# Patient Record
Sex: Male | Born: 1995 | Race: White | Hispanic: No | Marital: Married | Smoking: Never smoker
Health system: Southern US, Community
[De-identification: ages and names within clinical notes are randomized; demographics above are authoritative.]

---
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
## 2015-07-04 ENCOUNTER — Encounter: Payer: Self-pay | Admitting: Sports Medicine

## 2015-07-04 ENCOUNTER — Ambulatory Visit: Payer: BC Managed Care – PPO | Admitting: Sports Medicine

## 2015-07-04 ENCOUNTER — Ambulatory Visit (INDEPENDENT_AMBULATORY_CARE_PROVIDER_SITE_OTHER): Payer: BC Managed Care – PPO | Admitting: Sports Medicine

## 2015-07-04 VITALS — BP 129/91 | HR 64 | Resp 18

## 2015-07-04 DIAGNOSIS — L600 Ingrowing nail: Secondary | ICD-10-CM | POA: Diagnosis not present

## 2015-07-04 DIAGNOSIS — B353 Tinea pedis: Secondary | ICD-10-CM | POA: Diagnosis not present

## 2015-07-04 NOTE — Patient Instructions (Signed)
Soak Instructions    Place 1/2 cup Epsom salt  in a quart of warm tap water.  You may use a band aid large enough to cover the area or use gauze and tape.  Apply other medications to the area as directed by the doctor such as cortisporin otic solution (ear drops) or neosporin.  IF YOUR SKIN BECOMES IRRITATED WHILE USING THESE INSTRUCTIONS, IT IS OKAY TO SWITCH TO  WHITE VINEGAR AND WATER.

## 2015-07-04 NOTE — Progress Notes (Deleted)
   Subjective:    Patient ID: Corey Pham, male    DOB: 19-Mar-1996, 19 y.o.   MRN: [id number]  HPI    Review of Systems  All other systems reviewed and are negative.      Objective:   Physical Exam        Assessment & Plan:

## 2015-07-04 NOTE — Progress Notes (Signed)
Patient ID: Corey Pham, male   DOB: December 23, 1995, 19 y.o.   MRN: [id number] Subjective: Corey Pham is a 19 y.o. male patient seen today in office for evaluation of peeling skin around right 3rd toenail; Patient states that about 3 months ago noticed skin on bottom of feet and around 3rd toe peeling; applied lotrimin cream with improvement in plantar skin however the 3rd toe began to hurt more and a blister formed at the site; at this time patient admits to popping the blister and also trimming the corner of the nail because it was appearing to ingrow >1 month ago. Patient denies constitutional symptoms. Patient denies history of Diabetes, Neuropathy, or Vascular disease. Patient has no other pedal complaints at this time.   There are no active problems to display for this patient.  No current outpatient prescriptions on file prior to visit.   No current facility-administered medications on file prior to visit.   No Known Allergies  ROS: Negative    Objective: Physical Exam  General: Well developed, nourished, no acute distress, awake, alert and oriented x 3  Vascular: Dorsalis pedis artery 2/4 bilateral, Posterior tibial artery 2/4 bilateral, skin temperature warm to warm proximal to distal bilateral lower extremities, no varicosities, pedal hair present bilateral.  Neurological: Gross sensation present via light touch bilateral   Dermatological: Skin is warm, dry, and supple bilateral, Nails 1-10 are well manicured; to Right 3rd toe there in no signs of ingrown nail at medial or lateral margin; there is scaly skin present at distal tuft with healthy pink skin underneath likely from previous history of ingrown nail to area. There is no webspace macerations present bilateral, no open lesions present bilateral, no callus/corns/hyperkeratotic tissue present bilateral. No signs of infection bilateral. No concerning skin changes for fungus at this time to plantar aspect of both feet.    Musculoskeletal: No boney deformities noted bilateral. Muscular strength within normal limits without pain or limitation on range of motion. No pain with calf compression bilateral.  Assessment and Plan:  Problem List Items Addressed This Visit    None    Visit Diagnoses    Ingrown nail    -  Primary    Right 3rd toe, currently asymptomatic patient removed lateral border 1 month ago    Tinea pedis of both feet        Currently controlled with OTC Lotrimin Cream       -Discussed treatment options; risks, benefits, alternatives discussed; all questions answered  -Advised patient to closely watch the right 3rd toe for reoccurrence; may need PNA in future -Advised patient to soak with Episom salt to allow the skin at the right 3rd toe to sloft off and apply topical neosporin to prevent infection for 1 week -Educated patient on foot hygiene/care; change socks frequently; air out shoes; antifungal spray/creams -Continue with Lotrimin cream daily as needed -Patient to return as needed for follow up evaluation or sooner if symptoms worsen.  Asencion Islamitorya Kylani Wires, DPM

## 2015-08-16 ENCOUNTER — Ambulatory Visit: Payer: Self-pay | Admitting: Physician Assistant

## 2015-09-13 ENCOUNTER — Ambulatory Visit: Payer: Self-pay | Admitting: Physician Assistant

## 2015-10-04 ENCOUNTER — Ambulatory Visit: Payer: Self-pay | Admitting: Physician Assistant

## 2016-04-22 ENCOUNTER — Ambulatory Visit: Payer: Self-pay | Admitting: Physician Assistant

## 2017-05-24 ENCOUNTER — Emergency Department: Payer: BC Managed Care – PPO

## 2017-05-24 ENCOUNTER — Emergency Department
Admission: EM | Admit: 2017-05-24 | Discharge: 2017-05-24 | Disposition: A | Discharge status: Home / Self Care | Payer: BC Managed Care – PPO | Attending: Emergency Medicine | Admitting: Emergency Medicine

## 2017-05-24 ENCOUNTER — Encounter: Payer: Self-pay | Admitting: Emergency Medicine

## 2017-05-24 DIAGNOSIS — R0789 Other chest pain: Secondary | ICD-10-CM

## 2017-05-24 DIAGNOSIS — R000 Tachycardia, unspecified: Secondary | ICD-10-CM | POA: Insufficient documentation

## 2017-05-24 LAB — TROPONIN I
Troponin I: <0.03 ng/mL (ref <0.03)
Troponin I: <0.03 ng/mL (ref <0.03)

## 2017-05-24 LAB — FIBRIN DERIVATIVES D-DIMER (ARMC ONLY): Fibrin derivatives D-dimer (ARMC): 157.89 (ref 0.00–499.00)

## 2017-05-24 LAB — BASIC METABOLIC PANEL
Anion gap: 7 (ref 5–15)
BUN: 16 mg/dL (ref 6–20)
CALCIUM: 9.1 mg/dL (ref 8.9–10.3)
CO2: 29 mmol/L (ref 22–32)
CREATININE: 1.19 mg/dL (ref 0.61–1.24)
Chloride: 107 mmol/L (ref 101–111)
GFR calc non Af Amer: >60 mL/min (ref >60)
GLUCOSE: 104 mg/dL — AB (ref 65–99)
Potassium: 3.5 mmol/L (ref 3.5–5.1)
Sodium: 143 mmol/L (ref 135–145)

## 2017-05-24 LAB — CBC
HCT: 42.9 % (ref 40.0–52.0)
Hemoglobin: 15.3 g/dL (ref 13.0–18.0)
MCH: 31.6 pg (ref 26.0–34.0)
MCHC: 35.6 g/dL (ref 32.0–36.0)
MCV: 88.7 fL (ref 80.0–100.0)
Platelets: 247 10*3/uL (ref 150–440)
RBC: 4.84 MIL/uL (ref 4.40–5.90)
RDW: 12.7 % (ref 11.5–14.5)
WBC: 15.9 10*3/uL — ABNORMAL HIGH (ref 3.8–10.6)

## 2017-05-24 MED ORDER — ACETAMINOPHEN 325 MG PO TABS
650.0000 mg | ORAL_TABLET | Freq: Once | ORAL | Status: AC
  Administered 2017-05-24: 650 mg via ORAL
  Filled 2017-05-24: qty 2

## 2017-05-24 NOTE — ED Triage Notes (Signed)
Patient states that he started having chest pain, shortness of breath and vomiting about an hour and half ago. Patient denies any cardiac history.

## 2017-05-24 NOTE — ED Notes (Signed)
Patient transported to X-ray 

## 2017-05-24 NOTE — ED Provider Notes (Signed)
[facility] Emergency Department Provider Note ____________________________________________   First MD Initiated Contact with Patient 05/24/17 [phone]     (approximate)  I have reviewed the triage vital signs and the nursing notes.   HISTORY  Chief Complaint Chest Pain; Shortness of Breath; and Emesis    HPI Corey Pham is a 21 y.o. male with no past medical history who presents with chest pain acute onset approximately 6 hours ago, substernal and nonradiating.patient states it started acutely when he was walking his dog, but it is not exertional. It is somewhat pleuritic. Patient states that afterwards he began to have chills and then had one episode of vomiting. Patient denies cough, fever, or shortness of breath. He also denies leg swelling or leg pain. No prior history of this chest pain.  History reviewed. No pertinent past medical history.  There are no active problems to display for this patient.   History reviewed. No pertinent surgical history.  Prior to Admission medications   Not on File    Allergies Patient has no known allergies.  No family history on file.  Social History Social History  Substance Use Topics  . Smoking status: Never Smoker  . Smokeless tobacco: Never Used  . Alcohol use 0.0 oz/week    Review of Systems  Constitutional: positive for chills Eyes: No visual changes. ENT: No sore throat. Cardiovascular: positive for chest pain Respiratory: Denies shortness of breath. Gastrointestinal: positive for one episode of vomiting  No diarrhea.  Genitourinary: Negative for flank pain Musculoskeletal: Negative for back pain. Skin: Negative for rash. Neurological: Negative for headaches, focal weakness or numbness.   ____________________________________________   PHYSICAL EXAM:  VITAL SIGNS: ED Triage Vitals  Enc Vitals Group     BP 05/24/17 0458 (!) 135/95     Pulse Rate 05/24/17 0458 99     Resp 05/24/17  0458 18     Temp 05/24/17 0458 98.6 F (37 C)     Temp Source 05/24/17 0458 Oral     SpO2 05/24/17 0458 100 %     Weight 05/24/17 0456 210 lb (95.3 kg)     Height 05/24/17 0456 5\' 8"  (1.727 m)     Head Circumference --      Peak Flow --      Pain Score 05/24/17 0456 8     Pain Loc --      Pain Edu? --      Excl. in GC? --     Constitutional: Alert and oriented. Well appearing and in no acute distress. Eyes: Conjunctivae are normal.  Head: Atraumatic. Nose: No congestion/rhinnorhea. Mouth/Throat: Mucous membranes are moist.   Neck: Normal range of motion.  Cardiovascular: borderline tachycardic, regular rhythm. Grossly normal heart sounds.  Good peripheral circulation. Respiratory: Normal respiratory effort.  No retractions. Lungs CTAB. Gastrointestinal: Soft and nontender. No distention.  Genitourinary: No CVA tenderness. Musculoskeletal: No lower extremity edema.  No calf tenderness.  Extremities warm and well perfused.  Neurologic:  Normal speech and language. No gross focal neurologic deficits are appreciated.  Skin:  Skin is warm and dry. No rash noted. Psychiatric: Mood and affect are normal. Speech and behavior are normal.  ____________________________________________   LABS (all labs ordered are listed, but only abnormal results are displayed)  Labs Reviewed  BASIC METABOLIC PANEL - Abnormal; Notable for the following:       Result Value   Glucose, Bld 104 (*)    All other components within normal limits  CBC -  Abnormal; Notable for the following:    WBC 15.9 (*)    All other components within normal limits  TROPONIN I  FIBRIN DERIVATIVES D-DIMER ([facility] ONLY)  TROPONIN I   ____________________________________________  EKG  ED ECG REPORT I, Dionne Bucy, the attending physician, personally viewed and interpreted this ECG.  Date: 05/24/2017 EKG Time: 457 Rate: 102 Rhythm: sinus tachycardia QRS Axis: normal Intervals: normal ST/T Wave  abnormalities: normal ST/T waves, small Q waves in lateral and inferior leads Narrative Interpretation: nonspecific findings, no evidence of acute ischemia  ____________________________________________  RADIOLOGY  Chest x-ray normal  ____________________________________________   PROCEDURES  Procedure(s) performed: No    Critical Care performed: No ____________________________________________   INITIAL IMPRESSION / ASSESSMENT AND PLAN / ED COURSE  Pertinent labs & imaging results that were available during my care of the patient were reviewed by me and considered in my medical decision making (see chart for details).  21 year old male with no past medical history and no ACS or PE risk factors presents with acute onset of atypical chest pain followed by chills and an episode of vomiting.  On exam patient is extremely well-appearing, vital signs are normal except for intermittent low-grade tachycardia, and exam is otherwise as described.  EKG and chest x-ray are normal and first troponin is negative. Only significant lab finding is elevated white blood cell count, which is nonspecific.  Overall presentation most consistent with onset of viral syndrome vs musculoskeletal cause.  Patient has no ACS risk factors and no PE risk factors. the patient's EKG is not consistent with acute ischemia; given the nonspecific Q wave finding and since patient's first troponin was drawn less than 4 hours from the onset of pain, I will repeat a second troponin now.  With tachycardia I cannot apply the PERC rule, so I will obtain d-dimer to rule out PE.  If negative and patient continues to feel well, will discharge home.   ----------------------------------------- 10:14 AM on 05/24/2017 -----------------------------------------  Repeat troponin and d-dimer are negative. Patient states he is feeling better. No further vomiting or worsening pain in the ER.  Patient feels well to go home. Return precautions  given  ____________________________________________   FINAL CLINICAL IMPRESSION(S) / ED DIAGNOSES  Final diagnoses:  Atypical chest pain      NEW MEDICATIONS STARTED DURING THIS VISIT:  There are no discharge medications for this patient.    Note:  This document was prepared using Dragon voice recognition software and may include unintentional dictation errors.    Dionne Bucy, MD 05/24/17 [phone]

## 2017-05-24 NOTE — Discharge Instructions (Signed)
You may call [phone] or [phone] for a referral service to get a primary care doctor.  Return to the ER for new or worsening pain, constant pain, weakness or lightheadedness, difficulty breathing, or any other new or worsening symptoms that concern you.

## 2018-02-09 IMAGING — CR DG CHEST 2V
1 series · 2 of 2 positions shown · non-contrast
Comparison: None.

CLINICAL DATA: Chest pain beginning this morning, vomiting.

EXAM:
CHEST  2 VIEW

[Series 1: dg chest 2 view · 0.14mm/px · 2 of 2 slices shown]
[im 1/2]
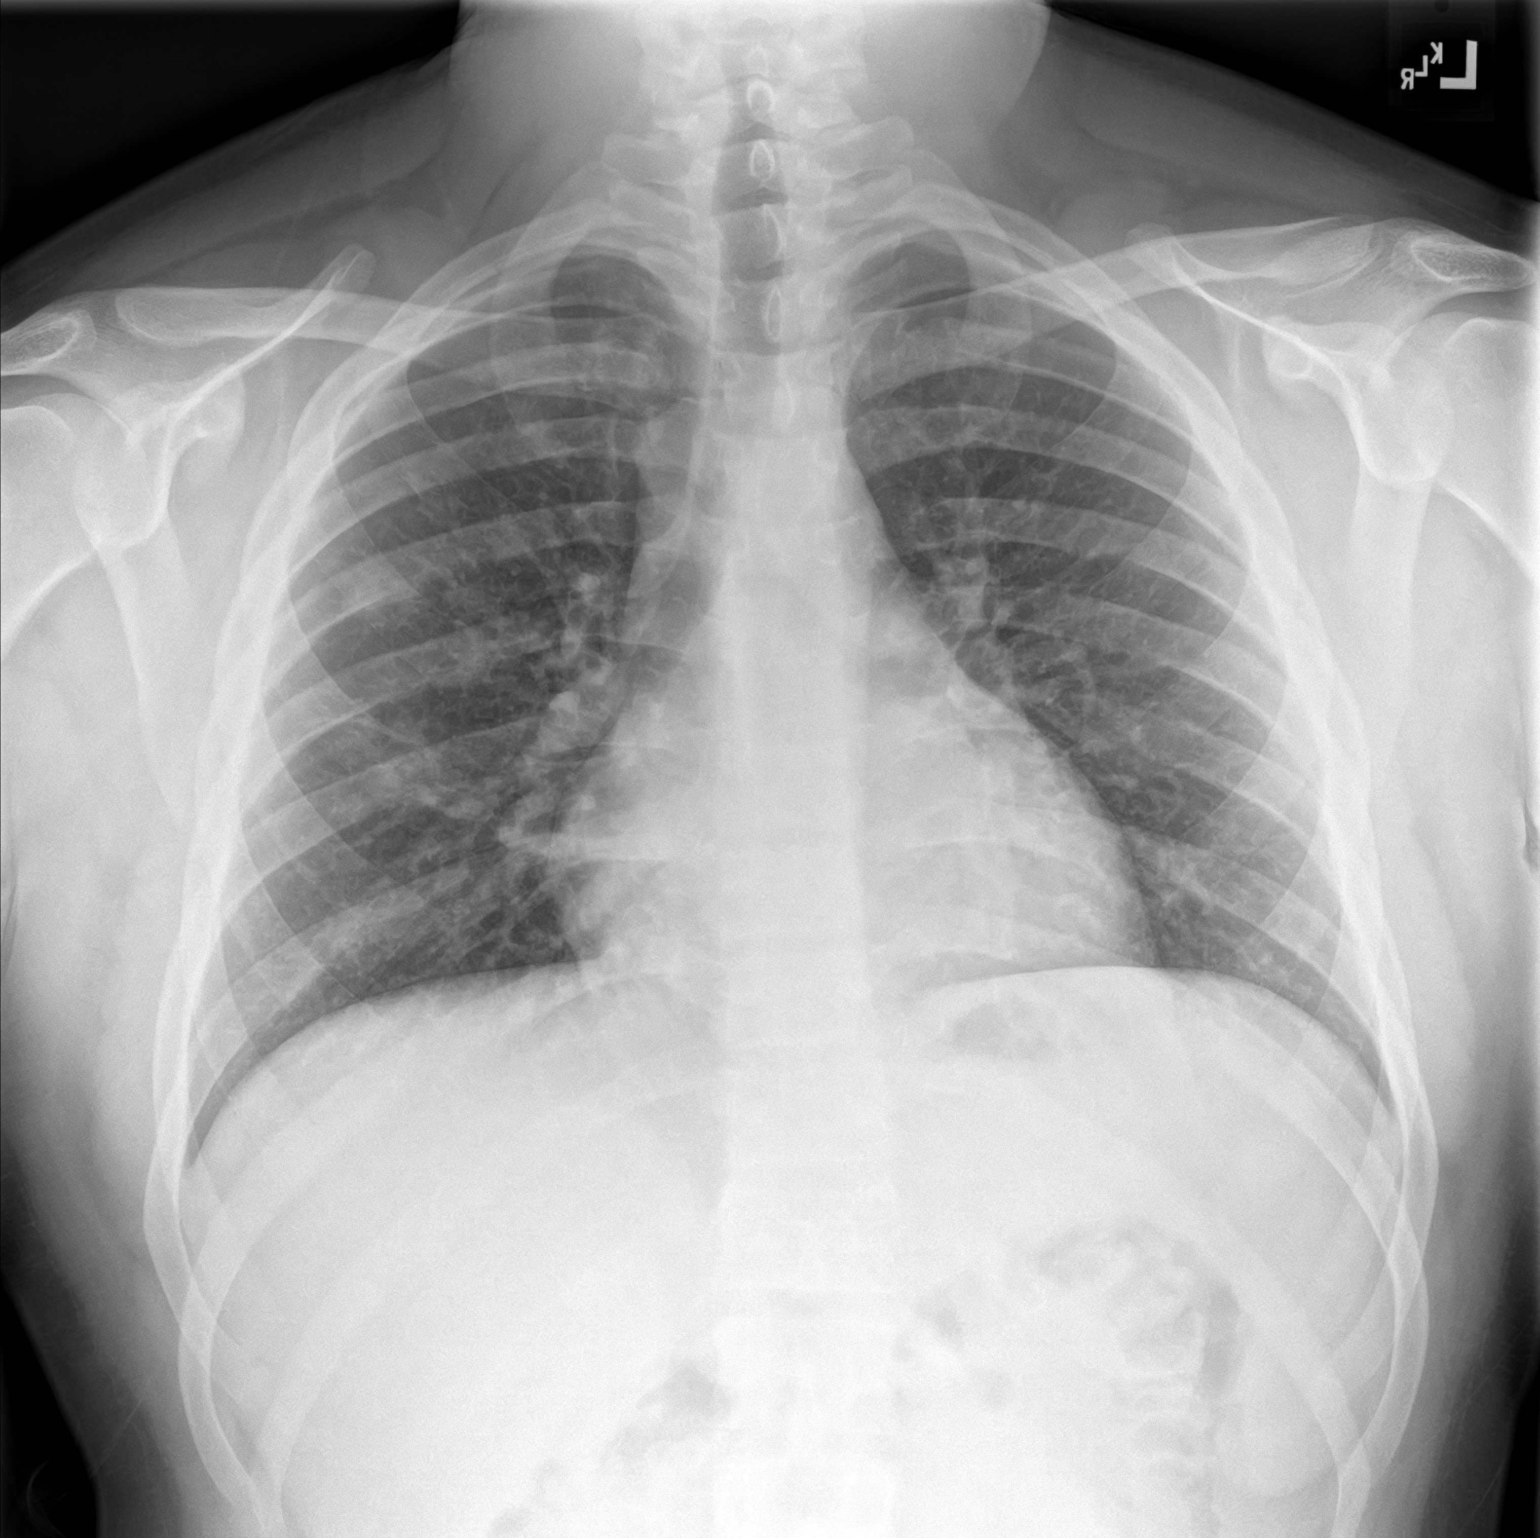
[im 2/2]
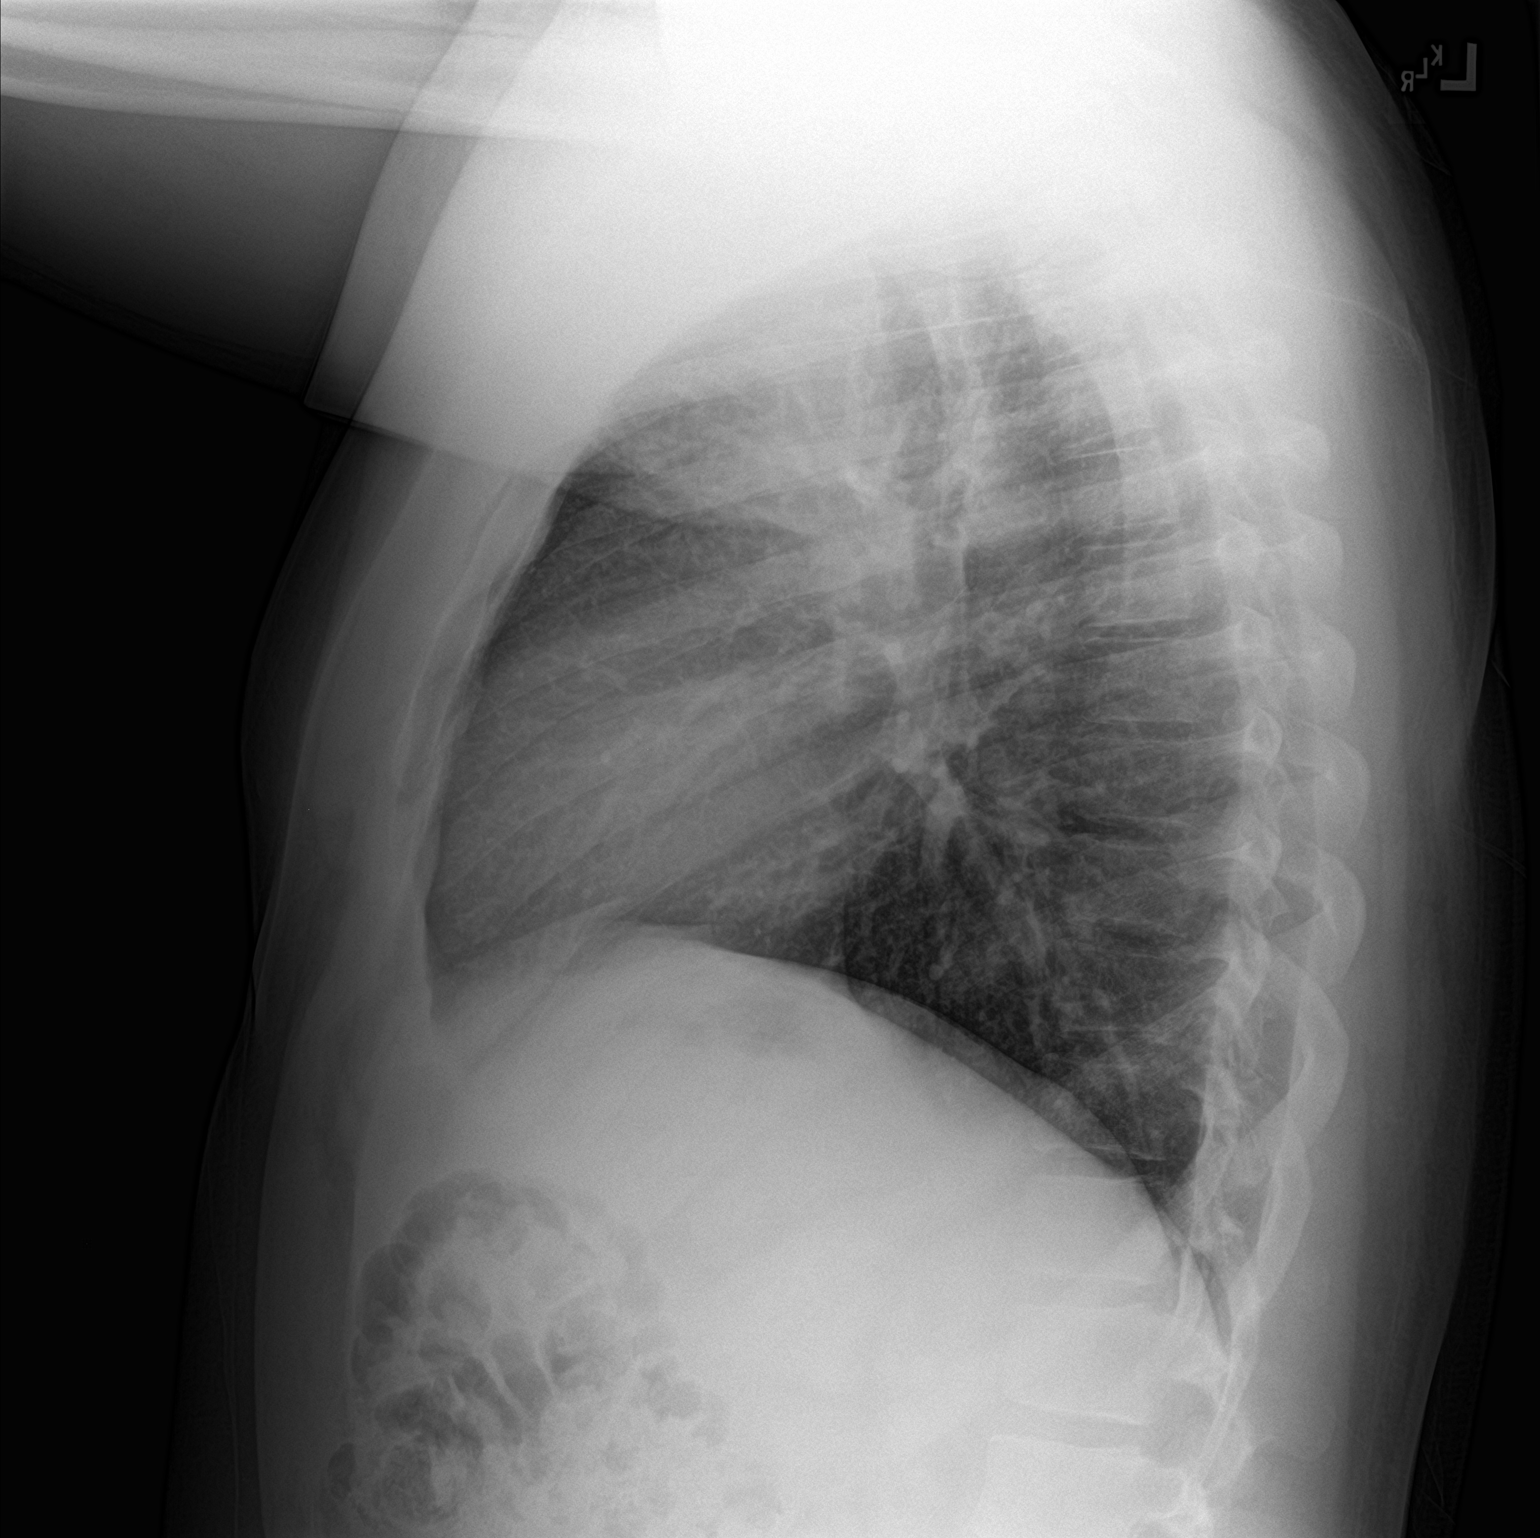

[2 of 2 positions shown; findings below may reference images not displayed]

FINDINGS: The heart size and mediastinal contours are within normal limits.
Both lungs are clear. The visualized skeletal structures are
unremarkable.
IMPRESSION: Normal chest radiograph.

## 2019-04-29 ENCOUNTER — Other Ambulatory Visit: Payer: Self-pay

## 2019-04-29 DIAGNOSIS — Z20822 Contact with and (suspected) exposure to covid-19: Secondary | ICD-10-CM

## 2019-05-01 LAB — SPECIMEN STATUS REPORT

## 2019-05-01 LAB — NOVEL CORONAVIRUS, NAA: SARS-CoV-2, NAA: NOT DETECTED

## 2019-05-17 ENCOUNTER — Other Ambulatory Visit: Payer: Self-pay

## 2019-05-17 DIAGNOSIS — Z20822 Contact with and (suspected) exposure to covid-19: Secondary | ICD-10-CM

## 2019-05-19 LAB — NOVEL CORONAVIRUS, NAA: SARS-CoV-2, NAA: NOT DETECTED

## 2020-05-30 ENCOUNTER — Encounter: Payer: Self-pay | Admitting: Podiatry

## 2020-05-30 ENCOUNTER — Ambulatory Visit: Payer: PRIVATE HEALTH INSURANCE | Admitting: Podiatry

## 2020-05-30 ENCOUNTER — Other Ambulatory Visit: Payer: Self-pay

## 2020-05-30 DIAGNOSIS — M79674 Pain in right toe(s): Secondary | ICD-10-CM | POA: Diagnosis not present

## 2020-05-30 DIAGNOSIS — L600 Ingrowing nail: Secondary | ICD-10-CM

## 2020-05-31 ENCOUNTER — Encounter: Payer: Self-pay | Admitting: Podiatry

## 2020-05-31 NOTE — Progress Notes (Signed)
  Subjective:  Patient ID: Corey Pham, male    DOB: 06/07/1996,  MRN: [id number]  Chief Complaint  Patient presents with  . Nail Problem    Patient presents today for ingrown toenail right hallux x 3 weeks.  He says its sore but not that painful.      24 y.o. male presents with the above complaint.  Patient presents with right hallux lateral border ingrown that has been going on for quite some time.  Patient states is painful to touch.  He had an ingrown removed long time ago by Dr. Marylene Land back in 2016.  He denies any other acute complaints.  He has been going for 3 weeks he did some clipping to get the nail out.  But it keeps happening and reoccurring.  He denies any other acute complaints.  He would like to have it completely removed.   Review of Systems: Negative except as noted in the HPI. Denies N/V/F/Ch.  History reviewed. No pertinent past medical history. No current outpatient medications on file.  Social History   Tobacco Use  Smoking Status Never Smoker  Smokeless Tobacco Never Used    No Known Allergies Objective:  There were no vitals filed for this visit. There is no height or weight on file to calculate BMI. Constitutional Well developed. Well nourished.  Vascular Dorsalis pedis pulses palpable bilaterally. Posterior tibial pulses palpable bilaterally. Capillary refill normal to all digits.  No cyanosis or clubbing noted. Pedal hair growth normal.  Neurologic Normal speech. Oriented to person, place, and time. Epicritic sensation to light touch grossly present bilaterally.  Dermatologic Painful ingrowing nail at lateral nail borders of the hallux nail right. No other open wounds. No skin lesions.  Orthopedic: Normal joint ROM without pain or crepitus bilaterally. No visible deformities. No bony tenderness.   Radiographs: None Assessment:   1. Ingrown toenail of right foot   2. Great toe pain, right    Plan:  Patient was evaluated and treated and  all questions answered.  Ingrown Nail, right -Patient elects to proceed with minor surgery to remove ingrown toenail removal today. Consent reviewed and signed by patient. -Ingrown nail excised. See procedure note. -Educated on post-procedure care including soaking. Written instructions provided and reviewed. -Patient to follow up in 2 weeks for nail check.  Procedure: Excision of Ingrown Toenail Location: Right 1st toe lateral nail borders. Anesthesia: Lidocaine 1% plain; 1.5 mL and Marcaine 0.5% plain; 1.5 mL, digital block. Skin Prep: Betadine. Dressing: Silvadene; telfa; dry, sterile, compression dressing. Technique: Following skin prep, the toe was exsanguinated and a tourniquet was secured at the base of the toe. The affected nail border was freed, split with a nail splitter, and excised. Chemical matrixectomy was then performed with phenol and irrigated out with alcohol. The tourniquet was then removed and sterile dressing applied. Disposition: Patient tolerated procedure well. Patient to return in 2 weeks for follow-up.   No follow-ups on file.

## 2020-11-13 ENCOUNTER — Ambulatory Visit: Payer: Self-pay | Admitting: Family Medicine

## 2022-04-11 ENCOUNTER — Other Ambulatory Visit: Payer: Self-pay

## 2022-04-11 ENCOUNTER — Observation Stay
Admission: EM | Admit: 2022-04-11 | Discharge: 2022-04-12 | Disposition: A | Discharge status: Home / Self Care | Payer: PRIVATE HEALTH INSURANCE | Attending: Internal Medicine | Admitting: Internal Medicine

## 2022-04-11 DIAGNOSIS — E871 Hypo-osmolality and hyponatremia: Secondary | ICD-10-CM

## 2022-04-11 DIAGNOSIS — E860 Dehydration: Secondary | ICD-10-CM | POA: Diagnosis not present

## 2022-04-11 DIAGNOSIS — A419 Sepsis, unspecified organism: Secondary | ICD-10-CM

## 2022-04-11 DIAGNOSIS — R112 Nausea with vomiting, unspecified: Secondary | ICD-10-CM | POA: Diagnosis present

## 2022-04-11 DIAGNOSIS — F17200 Nicotine dependence, unspecified, uncomplicated: Secondary | ICD-10-CM | POA: Insufficient documentation

## 2022-04-11 DIAGNOSIS — Z20822 Contact with and (suspected) exposure to covid-19: Secondary | ICD-10-CM | POA: Diagnosis not present

## 2022-04-11 DIAGNOSIS — R519 Headache, unspecified: Secondary | ICD-10-CM | POA: Insufficient documentation

## 2022-04-11 DIAGNOSIS — Z720 Tobacco use: Secondary | ICD-10-CM

## 2022-04-11 DIAGNOSIS — J189 Pneumonia, unspecified organism: Secondary | ICD-10-CM | POA: Diagnosis not present

## 2022-04-11 LAB — CBC WITH DIFFERENTIAL/PLATELET
Abs Immature Granulocytes: 0.02 10*3/uL (ref 0.00–0.07)
Basophils Absolute: 0 10*3/uL (ref 0.0–0.1)
Basophils Relative: 0 %
Eosinophils Absolute: 0 10*3/uL (ref 0.0–0.5)
Eosinophils Relative: 0 %
HCT: 43.1 % (ref 39.0–52.0)
Hemoglobin: 15.2 g/dL (ref 13.0–17.0)
Immature Granulocytes: 0 %
Lymphocytes Relative: 18 %
Lymphs Abs: 1.4 10*3/uL (ref 0.7–4.0)
MCH: 30 pg (ref 26.0–34.0)
MCHC: 35.3 g/dL (ref 30.0–36.0)
MCV: 85.2 fL (ref 80.0–100.0)
Monocytes Absolute: 0.8 10*3/uL (ref 0.1–1.0)
Monocytes Relative: 11 %
Neutro Abs: 5.5 10*3/uL (ref 1.7–7.7)
Neutrophils Relative %: 71 %
Platelets: 266 10*3/uL (ref 150–400)
RBC: 5.06 MIL/uL (ref 4.22–5.81)
RDW: 11.7 % (ref 11.5–15.5)
WBC: 7.8 10*3/uL (ref 4.0–10.5)
nRBC: 0 % (ref 0.0–0.2)

## 2022-04-11 LAB — CBC
HCT: 42.8 % (ref 39.0–52.0)
Hemoglobin: 15.2 g/dL (ref 13.0–17.0)
MCH: 29.9 pg (ref 26.0–34.0)
MCHC: 35.5 g/dL (ref 30.0–36.0)
MCV: 84.3 fL (ref 80.0–100.0)
Platelets: 251 10*3/uL (ref 150–400)
RBC: 5.08 MIL/uL (ref 4.22–5.81)
RDW: 11.6 % (ref 11.5–15.5)
WBC: 7.9 10*3/uL (ref 4.0–10.5)
nRBC: 0 % (ref 0.0–0.2)

## 2022-04-11 LAB — COMPREHENSIVE METABOLIC PANEL
ALT: 40 U/L (ref 0–44)
AST: 34 U/L (ref 15–41)
Albumin: 4.7 g/dL (ref 3.5–5.0)
Alkaline Phosphatase: 51 U/L (ref 38–126)
Anion gap: 10 (ref 5–15)
BUN: 17 mg/dL (ref 6–20)
CO2: 23 mmol/L (ref 22–32)
Calcium: 9.4 mg/dL (ref 8.9–10.3)
Chloride: 101 mmol/L (ref 98–111)
Creatinine, Ser: 1.14 mg/dL (ref 0.61–1.24)
GFR, Estimated: >60 mL/min (ref >60)
Glucose, Bld: 94 mg/dL (ref 70–99)
Potassium: 3.8 mmol/L (ref 3.5–5.1)
Sodium: 134 mmol/L — ABNORMAL LOW (ref 135–145)
Total Bilirubin: 2 mg/dL — ABNORMAL HIGH (ref 0.3–1.2)
Total Protein: 8 g/dL (ref 6.5–8.1)

## 2022-04-11 LAB — URINALYSIS, ROUTINE W REFLEX MICROSCOPIC
Bilirubin Urine: NEGATIVE
Glucose, UA: NEGATIVE mg/dL
Hgb urine dipstick: NEGATIVE
Ketones, ur: 80 mg/dL — AB
Leukocytes,Ua: NEGATIVE
Nitrite: NEGATIVE
Protein, ur: NEGATIVE mg/dL
Specific Gravity, Urine: 1.024 (ref 1.005–1.030)
pH: 5 (ref 5.0–8.0)

## 2022-04-11 LAB — SARS CORONAVIRUS 2 BY RT PCR: SARS Coronavirus 2 by RT PCR: NEGATIVE

## 2022-04-11 LAB — LIPASE, BLOOD: Lipase: 27 U/L (ref 11–51)

## 2022-04-11 MED ORDER — ONDANSETRON 4 MG PO TBDP
4.0000 mg | ORAL_TABLET | Freq: Once | ORAL | Status: AC | PRN
  Administered 2022-04-11: 4 mg via ORAL
  Filled 2022-04-11: qty 1

## 2022-04-11 NOTE — ED Provider Notes (Signed)
[facility] Provider Note    Event Date/Time   First MD Initiated Contact with Patient 04/11/22 2339     (approximate)   History   Emesis and Nausea   HPI  Corey Pham is a 26 y.o. male who presents to the ED from home with a 4-day history of nausea, vomiting, darker stools, fever, body aches, bilateral neck pain and headache.  States he took COVID test at home which was negative.  Denies sick contacts.  Endorses mild cough.  States he has not taken PO well since Monday.  Has not taken any Tylenol today.  Denies recent travel or antibiotic use.  + Smoker.     Past Medical History  None   Active Problem List  There are no problems to display for this patient.    Past Surgical History  History reviewed. No pertinent surgical history.   Home Medications   Prior to Admission medications   Not on File     Allergies  Patient has no known allergies.   Family History  History reviewed. No pertinent family history.   Physical Exam  Triage Vital Signs: ED Triage Vitals  Enc Vitals Group     BP 04/11/22 2116 132/84     Pulse Rate 04/11/22 2116 (!) 136     Resp 04/11/22 2116 18     Temp 04/11/22 2116 99.1 F (37.3 C)     Temp Source 04/11/22 2116 Oral     SpO2 04/11/22 2116 99 %     Weight 04/11/22 2117 210 lb (95.3 kg)     Height 04/11/22 2117 5\' 8"  (1.727 m)     Head Circumference --      Peak Flow --      Pain Score 04/11/22 2124 0     Pain Loc --      Pain Edu? --      Excl. in GC? --     Updated Vital Signs: BP 133/80   Pulse (!) 116   Temp 99.1 F (37.3 C) (Oral)   Resp 18   Ht 5\' 8"  (1.727 m)   Wt 95.3 kg   SpO2 99%   BMI 31.93 kg/m    General: Awake, no distress.  CV:  Tachycardic.  Good peripheral perfusion.  Resp:  Normal effort.  CTA B. Abd:  Nontender to light or deep palpation.  No distention.  Other:  Bilateral TM dullness.  Oropharynx clear.  Mildly dry mucous membranes.  Supple neck without  meningismus.  No carotid bruits.  No petechiae.   ED Results / Procedures / Treatments  Labs (all labs ordered are listed, but only abnormal results are displayed) Labs Reviewed  COMPREHENSIVE METABOLIC PANEL - Abnormal; Notable for the following components:      Result Value   Sodium 134 (*)    Total Bilirubin 2.0 (*)    All other components within normal limits  URINALYSIS, ROUTINE W REFLEX MICROSCOPIC - Abnormal; Notable for the following components:   Color, Urine YELLOW (*)    APPearance CLEAR (*)    Ketones, ur 80 (*)    All other components within normal limits  SARS CORONAVIRUS 2 BY RT PCR  CULTURE, BLOOD (ROUTINE X 2)  CULTURE, BLOOD (ROUTINE X 2)  LIPASE, BLOOD  CBC  LACTIC ACID, PLASMA  PROCALCITONIN  MONONUCLEOSIS SCREEN  CBC WITH DIFFERENTIAL/PLATELET  CK  DIFFERENTIAL  LACTIC ACID, PLASMA     EKG  None   RADIOLOGY I have independently  visualized and interpreted patient's chest x-ray, CT head, CT abdomen/pelvis as well as noted the radiology interpretation:  Chest xray: No acute cardiopulmonary process  CT Head: No ICH  CT Abdomen/Pelvis: Right lower lobe infiltrate  Official radiology report(s): CT Abdomen Pelvis W Contrast  Result Date: 04/12/2022 CLINICAL DATA:  Nausea with vomiting and neck pain. EXAM: CT ABDOMEN AND PELVIS WITH CONTRAST TECHNIQUE: Multidetector CT imaging of the abdomen and pelvis was performed using the standard protocol following bolus administration of intravenous contrast. RADIATION DOSE REDUCTION: This exam was performed according to the departmental dose-optimization program which includes automated exposure control, adjustment of the mA and/or kV according to patient size and/or use of iterative reconstruction technique. CONTRAST:  161mL OMNIPAQUE IOHEXOL 300 MG/ML  SOLN COMPARISON:  None Available. FINDINGS: Lower chest: Very mild, hazy atelectasis versus early infiltrate is seen within the posterior aspect of the right lung  base. Hepatobiliary: No focal liver abnormality is seen. No gallstones, gallbladder wall thickening, or biliary dilatation. Pancreas: Unremarkable. No pancreatic ductal dilatation or surrounding inflammatory changes. Spleen: Normal in size without focal abnormality. Adrenals/Urinary Tract: Adrenal glands are unremarkable. Kidneys are normal, without renal calculi, focal lesion, or hydronephrosis. Bladder is unremarkable. Stomach/Bowel: Stomach is within normal limits. Appendix appears normal. No evidence of bowel wall thickening, distention, or inflammatory changes. Vascular/Lymphatic: No significant vascular findings are present. No enlarged abdominal or pelvic lymph nodes. Reproductive: Prostate is unremarkable. Other: No abdominal wall hernia or abnormality. No abdominopelvic ascites. Musculoskeletal: No acute or significant osseous findings. IMPRESSION: 1. Very mild posterior right basilar atelectasis and/or early infiltrate. 2. No acute or active process within the abdomen or pelvis. Electronically Signed   By: Virgina Norfolk M.D.   On: 04/12/2022 00:53   CT Head Wo Contrast  Result Date: 04/12/2022 CLINICAL DATA:  Nausea with vomiting and neck pain. EXAM: CT HEAD WITHOUT CONTRAST TECHNIQUE: Contiguous axial images were obtained from the base of the skull through the vertex without intravenous contrast. RADIATION DOSE REDUCTION: This exam was performed according to the departmental dose-optimization program which includes automated exposure control, adjustment of the mA and/or kV according to patient size and/or use of iterative reconstruction technique. COMPARISON:  None Available. FINDINGS: Brain: No evidence of acute infarction, hemorrhage, hydrocephalus, extra-axial collection or mass lesion/mass effect. Vascular: No hyperdense vessel or unexpected calcification. Skull: Normal. Negative for fracture or focal lesion. Sinuses/Orbits: No acute finding. Other: None. IMPRESSION: No acute intracranial  pathology. Electronically Signed   By: Virgina Norfolk M.D.   On: 04/12/2022 00:47   DG Chest Port 1 View  Result Date: 04/12/2022 CLINICAL DATA:  Fever and cough.  Nausea and vomiting since Monday. EXAM: PORTABLE CHEST 1 VIEW COMPARISON:  05/24/2017 FINDINGS: The heart size and mediastinal contours are within normal limits. Both lungs are clear. The visualized skeletal structures are unremarkable. IMPRESSION: No active disease. Electronically Signed   By: Lucienne Capers M.D.   On: 04/12/2022 00:25     PROCEDURES:  Critical Care performed: Yes, see critical care procedure note(s)  CRITICAL CARE Performed by: Paulette Blanch   Total critical care time: 30 minutes  Critical care time was exclusive of separately billable procedures and treating other patients.  Critical care was necessary to treat or prevent imminent or life-threatening deterioration.  Critical care was time spent personally by me on the following activities: development of treatment plan with patient and/or surrogate as well as nursing, discussions with consultants, evaluation of patient's response to treatment, examination of patient, obtaining history from patient  or surrogate, ordering and performing treatments and interventions, ordering and review of laboratory studies, ordering and review of radiographic studies, pulse oximetry and re-evaluation of patient's condition.   Marland Kitchen1-3 Lead EKG Interpretation  Performed by: Paulette Blanch, MD Authorized by: Paulette Blanch, MD     Interpretation: abnormal     ECG rate:  110   ECG rate assessment: tachycardic     Rhythm: sinus tachycardia     Ectopy: none     Conduction: normal   Comments:     Patient placed on cardiac monitor to evaluate for arrhythmias    MEDICATIONS ORDERED IN ED: Medications  sodium chloride 0.9 % bolus 1,000 mL (has no administration in time range)    And  sodium chloride 0.9 % bolus 1,000 mL (has no administration in time range)  cefTRIAXone  (ROCEPHIN) 2 g in sodium chloride 0.9 % 100 mL IVPB (has no administration in time range)  azithromycin (ZITHROMAX) 500 mg in sodium chloride 0.9 % 250 mL IVPB (has no administration in time range)  ondansetron (ZOFRAN-ODT) disintegrating tablet 4 mg (4 mg Oral Given 04/11/22 2130)  sodium chloride 0.9 % bolus 1,000 mL (1,000 mLs Intravenous New Bag/Given 04/12/22 0033)  ondansetron (ZOFRAN) injection 4 mg (4 mg Intravenous Given 04/12/22 0032)  ketorolac (TORADOL) 30 MG/ML injection 15 mg (15 mg Intravenous Given 04/12/22 0032)  iohexol (OMNIPAQUE) 300 MG/ML solution 100 mL (100 mLs Intravenous Contrast Given 04/12/22 0035)     IMPRESSION / MDM / ASSESSMENT AND PLAN / ED COURSE  I reviewed the triage vital signs and the nursing notes.                             26 year old male presenting with a 4-day history of nausea/vomiting, fever, cough, myalgias, headache and neck pain.  Differential diagnosis includes but is not limited to viral process such as COVID-19, influenza, community-acquired pneumonia, mononucleosis, meningitis, upper respiratory infection, gastroenteritis, appendicitis, etc. I have personally reviewed patient's records and note a last urgent care visit from May 2022 for suspected COVID.  Patient's presentation is most consistent with acute presentation with potential threat to life or bodily function.  The patient is on the cardiac monitor to evaluate for evidence of arrhythmia and/or significant heart rate changes.  Laboratory results demonstrate normal WBC 7.8, normal electrolytes, ketonuria, COVID is negative.  Will obtain blood cultures, lactic acid, procalcitonin, Monospot.  Obtain chest x-ray, CT head, abdomen/pelvis.  Initiate IV fluid hydration, IV Toradol for myalgias, IV Zofran for nausea.  Will reassess.  Clinical Course as of 04/12/22 0108  Fri Apr 12, 2022  0106 Updated patient and family member and elevated procalcitonin, unremarkable CT head, likely right lower  lobe pneumonia on CT.  Will initiate ED code sepsis, continue IV fluid resuscitation, broad-spectrum IV antibiotics.  Will consult hospitalist services for evaluation and admission. [JS]    Clinical Course User Index [JS] Paulette Blanch, MD     FINAL CLINICAL IMPRESSION(S) / ED DIAGNOSES   Final diagnoses:  Dehydration  Nausea and vomiting, unspecified vomiting type  Community acquired pneumonia of right lower lobe of lung  Sepsis, due to unspecified organism, unspecified whether acute organ dysfunction present [facility])     Rx / DC Orders   ED Discharge Orders     None        Note:  This document was prepared using Dragon voice recognition software and may include unintentional dictation errors.  Irean Hong, MD 04/12/22 [phone]

## 2022-04-11 NOTE — ED Triage Notes (Addendum)
Pt presents to ER from home c/o nausea and vomiting since Monday night.  Also c/o neck pain and HA.  Pt denies any sick contacts that he knows of.  States his stools his stools have been darker in nature.  Pt states he had fever of 104 before coming in today.  Has not taken any tylenol recently.  States he took covid test at home that was negative.  Pt is A&O x4 at this time in NAD.

## 2022-04-12 ENCOUNTER — Emergency Department: Payer: PRIVATE HEALTH INSURANCE

## 2022-04-12 DIAGNOSIS — A419 Sepsis, unspecified organism: Secondary | ICD-10-CM

## 2022-04-12 DIAGNOSIS — E871 Hypo-osmolality and hyponatremia: Secondary | ICD-10-CM

## 2022-04-12 DIAGNOSIS — J189 Pneumonia, unspecified organism: Principal | ICD-10-CM

## 2022-04-12 DIAGNOSIS — Z720 Tobacco use: Secondary | ICD-10-CM | POA: Diagnosis not present

## 2022-04-12 LAB — CBC
HCT: 36.7 % — ABNORMAL LOW (ref 39.0–52.0)
Hemoglobin: 13.1 g/dL (ref 13.0–17.0)
MCH: 30.3 pg (ref 26.0–34.0)
MCHC: 35.7 g/dL (ref 30.0–36.0)
MCV: 85 fL (ref 80.0–100.0)
Platelets: 200 10*3/uL (ref 150–400)
RBC: 4.32 MIL/uL (ref 4.22–5.81)
RDW: 11.8 % (ref 11.5–15.5)
WBC: 5.8 10*3/uL (ref 4.0–10.5)
nRBC: 0 % (ref 0.0–0.2)

## 2022-04-12 LAB — BASIC METABOLIC PANEL
Anion gap: 8 (ref 5–15)
BUN: 16 mg/dL (ref 6–20)
CO2: 20 mmol/L — ABNORMAL LOW (ref 22–32)
Calcium: 8.3 mg/dL — ABNORMAL LOW (ref 8.9–10.3)
Chloride: 109 mmol/L (ref 98–111)
Creatinine, Ser: 1.16 mg/dL (ref 0.61–1.24)
GFR, Estimated: >60 mL/min (ref >60)
Glucose, Bld: 88 mg/dL (ref 70–99)
Potassium: 4.2 mmol/L (ref 3.5–5.1)
Sodium: 137 mmol/L (ref 135–145)

## 2022-04-12 LAB — HIV ANTIBODY (ROUTINE TESTING W REFLEX): HIV Screen 4th Generation wRfx: NONREACTIVE

## 2022-04-12 LAB — PROCALCITONIN
Procalcitonin: 10.21 ng/mL
Procalcitonin: 7.06 ng/mL

## 2022-04-12 LAB — PROTIME-INR
INR: 1.1 (ref 0.8–1.2)
Prothrombin Time: 14.2 seconds (ref 11.4–15.2)

## 2022-04-12 LAB — CK: Total CK: 221 U/L (ref 49–397)

## 2022-04-12 LAB — LACTIC ACID, PLASMA
Lactic Acid, Venous: 1.1 mmol/L (ref 0.5–1.9)
Lactic Acid, Venous: 1.1 mmol/L (ref 0.5–1.9)

## 2022-04-12 LAB — MONONUCLEOSIS SCREEN: Mono Screen: NEGATIVE

## 2022-04-12 LAB — CORTISOL-AM, BLOOD: Cortisol - AM: 8.7 ug/dL (ref 6.7–22.6)

## 2022-04-12 MED ORDER — SODIUM CHLORIDE 0.9 % IV SOLN: 500.0000 mg | INTRAVENOUS | Status: DC

## 2022-04-12 MED ORDER — SODIUM CHLORIDE 0.9 % IV BOLUS
1000.0000 mL | Freq: Once | INTRAVENOUS | Status: AC
  Administered 2022-04-12: 1000 mL via INTRAVENOUS

## 2022-04-12 MED ORDER — SODIUM CHLORIDE 0.9 % IV SOLN
2.0000 g | INTRAVENOUS | Status: DC
  Administered 2022-04-12: 2 g via INTRAVENOUS
  Filled 2022-04-12: qty 20

## 2022-04-12 MED ORDER — SODIUM CHLORIDE 0.9 % IV SOLN: 2.0000 g | INTRAVENOUS | Status: DC

## 2022-04-12 MED ORDER — ACETAMINOPHEN 650 MG RE SUPP: 650.0000 mg | Freq: Four times a day (QID) | RECTAL | Status: DC | PRN

## 2022-04-12 MED ORDER — ENOXAPARIN SODIUM 40 MG/0.4ML IJ SOSY: 40.0000 mg | PREFILLED_SYRINGE | INTRAMUSCULAR | Status: DC

## 2022-04-12 MED ORDER — ACETAMINOPHEN 325 MG PO TABS
650.0000 mg | ORAL_TABLET | Freq: Four times a day (QID) | ORAL | Status: DC | PRN
  Administered 2022-04-12: 650 mg via ORAL
  Filled 2022-04-12: qty 2

## 2022-04-12 MED ORDER — TRAZODONE HCL 50 MG PO TABS: 25.0000 mg | ORAL_TABLET | Freq: Every evening | ORAL | Status: DC | PRN

## 2022-04-12 MED ORDER — ENOXAPARIN SODIUM 60 MG/0.6ML IJ SOSY
0.5000 mg/kg | PREFILLED_SYRINGE | INTRAMUSCULAR | Status: DC
  Administered 2022-04-12: 47.5 mg via SUBCUTANEOUS
  Filled 2022-04-12: qty 0.6

## 2022-04-12 MED ORDER — ONDANSETRON HCL 4 MG PO TABS: 4.0000 mg | ORAL_TABLET | Freq: Four times a day (QID) | ORAL | Status: DC | PRN

## 2022-04-12 MED ORDER — SODIUM CHLORIDE 0.9 % IV BOLUS (SEPSIS)
1000.0000 mL | Freq: Once | INTRAVENOUS | Status: AC
Start: 2022-04-12 — End: 2022-04-12
  Administered 2022-04-12: 1000 mL via INTRAVENOUS

## 2022-04-12 MED ORDER — SODIUM CHLORIDE 0.9 % IV BOLUS (SEPSIS): 1000.0000 mL | Freq: Once | INTRAVENOUS | Status: DC

## 2022-04-12 MED ORDER — SODIUM CHLORIDE 0.9 % IV BOLUS (SEPSIS)
1000.0000 mL | Freq: Once | INTRAVENOUS | Status: AC
  Administered 2022-04-12: 1000 mL via INTRAVENOUS

## 2022-04-12 MED ORDER — ONDANSETRON HCL 4 MG/2ML IJ SOLN: 4.0000 mg | Freq: Four times a day (QID) | INTRAMUSCULAR | Status: DC | PRN

## 2022-04-12 MED ORDER — IOHEXOL 300 MG/ML  SOLN
100.0000 mL | Freq: Once | INTRAMUSCULAR | Status: AC | PRN
  Administered 2022-04-12: 100 mL via INTRAVENOUS

## 2022-04-12 MED ORDER — KETOROLAC TROMETHAMINE 30 MG/ML IJ SOLN
15.0000 mg | Freq: Once | INTRAMUSCULAR | Status: AC
Start: 2022-04-12 — End: 2022-04-12
  Administered 2022-04-12: 15 mg via INTRAVENOUS
  Filled 2022-04-12: qty 1

## 2022-04-12 MED ORDER — MAGNESIUM HYDROXIDE 400 MG/5ML PO SUSP: 30.0000 mL | Freq: Every day | ORAL | Status: DC | PRN

## 2022-04-12 MED ORDER — SODIUM CHLORIDE 0.9 % IV SOLN
500.0000 mg | INTRAVENOUS | Status: DC
  Administered 2022-04-12: 500 mg via INTRAVENOUS
  Filled 2022-04-12: qty 5

## 2022-04-12 MED ORDER — AZITHROMYCIN 250 MG PO TABS: ORAL_TABLET | ORAL | 0 refills | Status: AC

## 2022-04-12 MED ORDER — ONDANSETRON HCL 4 MG PO TABS: 4.0000 mg | ORAL_TABLET | Freq: Four times a day (QID) | ORAL | 0 refills | Status: DC | PRN

## 2022-04-12 MED ORDER — ONDANSETRON HCL 4 MG/2ML IJ SOLN
4.0000 mg | Freq: Once | INTRAMUSCULAR | Status: AC
  Administered 2022-04-12: 4 mg via INTRAVENOUS
  Filled 2022-04-12: qty 2

## 2022-04-12 NOTE — Progress Notes (Signed)
PHARMACIST - PHYSICIAN COMMUNICATION  CONCERNING:  Enoxaparin (Lovenox) for DVT Prophylaxis    RECOMMENDATION: Patient was prescribed enoxaprin 40mg  q24 hours for VTE prophylaxis.   Filed Weights   04/11/22 2117 04/12/22 0217  Weight: 95.3 kg (210 lb) 94.8 kg (209 lb)    Body mass index is 31.78 kg/m.  Estimated Creatinine Clearance: 110.7 mL/min (by C-G formula based on SCr of 1.14 mg/dL).   Based on [facility] policy patient is candidate for enoxaparin 0.5mg /kg TBW SQ every 24 hours based on BMI being >30.  DESCRIPTION: Pharmacy has adjusted enoxaparin dose per [facility] policy.  Patient is now receiving enoxaparin 0.5 mg/kg every 24 hours   [facility], PharmD, [facility] 04/12/2022 3:30 AM

## 2022-04-12 NOTE — Discharge Summary (Signed)
Physician Discharge Summary  Corey Pham OQH:[id number] DOB: Sep 26, 1995 DOA: 04/11/2022  PCP: Pcp, No  Admit date: 04/11/2022 Discharge date: 04/12/2022  Admitted From: Home Disposition: Home  Recommendations for Outpatient Follow-up:  Follow up with new PCP as discussed in the next few weeks  Home Health: None Equipment/Devices: None  Discharge Condition: Stable CODE STATUS: Full Diet recommendation: Regular diet as tolerated  Brief/Interim Summary: Corey Pham is a 26 y.o. male with medical history significant for nicotine abuse who presented to the emergency room with acute onset of high fever with a Tmax of 104 yesterday, dyspnea fatigue tiredness with no significant cough or wheezing  Patient admitted as above with multiple complaints of fever dyspnea fatigue weakness without overt hypoxia nausea vomiting chest pain or hypoxia.  Patient initially admitted given concern for elevated procalcitonin and imaging consistent with community-acquired pneumonia.  This morning patient appears to have recovered remarkably well, given his age and rapidly improving symptoms patient was ambulated without any further hypoxia, he reports his fatigue weakness and general malaise has essentially resolved and is requesting discharge home.  This is certainly reasonable given his rapid improvement otherwise would continue azithromycin at discharge for remainder of antibiotic course.  We discussed need for close follow-up with PCP which she does not currently have.  Staff provided patient with a list of current providers in the area accepting patients.  Patient otherwise stable and agreeable for discharge home.  Discharge Diagnoses:  Principal Problem:   CAP (community acquired pneumonia) Active Problems:   Sepsis due to pneumonia [facility])   Nicotine abuse   Hyponatremia    Discharge Instructions  Discharge Instructions     Discharge patient   Complete by: As directed    Discharge disposition:  01-Home or Self Care   Discharge patient date: 04/12/2022      Allergies as of 04/12/2022   No Known Allergies      Medication List     TAKE these medications    azithromycin 250 MG tablet Commonly known as: Zithromax Z-Pak Take 2 tablets (500 mg) on  Day 1,  followed by 1 tablet (250 mg) once daily on Days 2 through 5.   ondansetron 4 MG tablet Commonly known as: ZOFRAN Take 1 tablet (4 mg total) by mouth every 6 (six) hours as needed for nausea.        No Known Allergies  Consultations: None  Procedures/Studies: CT Abdomen Pelvis W Contrast  Result Date: 04/12/2022 CLINICAL DATA:  Nausea with vomiting and neck pain. EXAM: CT ABDOMEN AND PELVIS WITH CONTRAST TECHNIQUE: Multidetector CT imaging of the abdomen and pelvis was performed using the standard protocol following bolus administration of intravenous contrast. RADIATION DOSE REDUCTION: This exam was performed according to the departmental dose-optimization program which includes automated exposure control, adjustment of the mA and/or kV according to patient size and/or use of iterative reconstruction technique. CONTRAST:  OMNIPAQUE IOHEXOL 300 MG/ML  SOLN COMPARISON:  None Available. FINDINGS: Lower chest: Very mild, hazy atelectasis versus early infiltrate is seen within the posterior aspect of the right lung base. Hepatobiliary: No focal liver abnormality is seen. No gallstones, gallbladder wall thickening, or biliary dilatation. Pancreas: Unremarkable. No pancreatic ductal dilatation or surrounding inflammatory changes. Spleen: Normal in size without focal abnormality. Adrenals/Urinary Tract: Adrenal glands are unremarkable. Kidneys are normal, without renal calculi, focal lesion, or hydronephrosis. Bladder is unremarkable. Stomach/Bowel: Stomach is within normal limits. Appendix appears normal. No evidence of bowel wall thickening, distention, or inflammatory changes. Vascular/Lymphatic: No significant  vascular  findings are present. No enlarged abdominal or pelvic lymph nodes. Reproductive: Prostate is unremarkable. Other: No abdominal wall hernia or abnormality. No abdominopelvic ascites. Musculoskeletal: No acute or significant osseous findings. IMPRESSION: 1. Very mild posterior right basilar atelectasis and/or early infiltrate. 2. No acute or active process within the abdomen or pelvis. Electronically Signed   By: Aram Candela M.D.   On: 04/12/2022 00:53   CT Head Wo Contrast  Result Date: 04/12/2022 CLINICAL DATA:  Nausea with vomiting and neck pain. EXAM: CT HEAD WITHOUT CONTRAST TECHNIQUE: Contiguous axial images were obtained from the base of the skull through the vertex without intravenous contrast. RADIATION DOSE REDUCTION: This exam was performed according to the departmental dose-optimization program which includes automated exposure control, adjustment of the mA and/or kV according to patient size and/or use of iterative reconstruction technique. COMPARISON:  None Available. FINDINGS: Brain: No evidence of acute infarction, hemorrhage, hydrocephalus, extra-axial collection or mass lesion/mass effect. Vascular: No hyperdense vessel or unexpected calcification. Skull: Normal. Negative for fracture or focal lesion. Sinuses/Orbits: No acute finding. Other: None. IMPRESSION: No acute intracranial pathology. Electronically Signed   By: Aram Candela M.D.   On: 04/12/2022 00:47   DG Chest Port 1 View  Result Date: 04/12/2022 CLINICAL DATA:  Fever and cough.  Nausea and vomiting since Monday. EXAM: PORTABLE CHEST 1 VIEW COMPARISON:  05/24/2017 FINDINGS: The heart size and mediastinal contours are within normal limits. Both lungs are clear. The visualized skeletal structures are unremarkable. IMPRESSION: No active disease. Electronically Signed   By: Burman Nieves M.D.   On: 04/12/2022 00:25     Subjective: No acute issues or events overnight   Discharge Exam: Vitals:   04/12/22 0217  04/12/22 0753  BP: 124/78 113/63  Pulse: 80 61  Resp: 20 16  Temp: 98.3 F (36.8 C) 98.4 F (36.9 C)  SpO2: 98% 99%   Vitals:   04/12/22 0030 04/12/22 0130 04/12/22 0217 04/12/22 0753  BP: 133/80 140/80 124/78 113/63  Pulse: (!) 116 79 80 61  Resp: 18  20 16   Temp:  98.9 F (37.2 C) 98.3 F (36.8 C) 98.4 F (36.9 C)  TempSrc:  Oral Oral   SpO2: 99% 97% 98% 99%  Weight:   94.8 kg   Height:   5\' 8"  (1.727 m)     General: Pt is alert, awake, not in acute distress Cardiovascular: RRR, S1/S2 +, no rubs, no gallops Respiratory: CTA bilaterally, no wheezing, no rhonchi Abdominal: Soft, NT, ND, bowel sounds + Extremities: no edema, no cyanosis    The results of significant diagnostics from this hospitalization (including imaging, microbiology, ancillary and laboratory) are listed below for reference.     Microbiology: Recent Results (from the past 240 hour(s))  SARS Coronavirus 2 by RT PCR (hospital order, performed in [facility] hospital lab) *cepheid single result test* Anterior Nasal Swab     Status: None   Collection Time: 04/11/22  9:28 PM   Specimen: Anterior Nasal Swab  Result Value Ref Range Status   SARS Coronavirus 2 by RT PCR NEGATIVE NEGATIVE Final    Comment: (NOTE) SARS-CoV-2 target nucleic acids are NOT DETECTED.  The SARS-CoV-2 RNA is generally detectable in upper and lower respiratory specimens during the acute phase of infection. The lowest concentration of SARS-CoV-2 viral copies this assay can detect is 250 copies / mL. A negative result does not preclude SARS-CoV-2 infection and should not be used as the sole basis for treatment or other patient management  decisions.  A negative result may occur with improper specimen collection / handling, submission of specimen other than nasopharyngeal swab, presence of viral mutation(s) within the areas targeted by this assay, and inadequate number of viral copies (<250 copies / mL). A negative result must be  combined with clinical observations, patient history, and epidemiological information.  Fact Sheet for Patients:   RoadLapTop.co.za  Fact Sheet for Healthcare Providers: http://kim-miller.com/  This test is not yet approved or  cleared by the [place] FDA and has been authorized for detection and/or diagnosis of SARS-CoV-2 by FDA under an Emergency Use Authorization (EUA).  This EUA will remain in effect (meaning this test can be used) for the duration of the COVID-19 declaration under Section 564(b)(1) of the Act, 21 U.S.C. section 360bbb-3(b)(1), unless the authorization is terminated or revoked sooner.  Performed at [facility], [address] [place], [place] [address]   Culture, blood (routine x 2)     Status: None (Preliminary result)   Collection Time: 04/12/22 12:20 AM   Specimen: BLOOD  Result Value Ref Range Status   Specimen Description BLOOD RIGHT FA  Final   Special Requests   Final    BOTTLES DRAWN AEROBIC AND ANAEROBIC Blood Culture adequate volume   Culture   Final    NO GROWTH < 12 HOURS Performed at [facility], [address], [place], [place] [address]    Report Status PENDING  Incomplete  Culture, blood (routine x 2)     Status: None (Preliminary result)   Collection Time: 04/12/22 12:20 AM   Specimen: BLOOD  Result Value Ref Range Status   Specimen Description BLOOD RIGHT HAND  Final   Special Requests   Final    BOTTLES DRAWN AEROBIC AND ANAEROBIC Blood Culture adequate volume   Culture   Final    NO GROWTH < 12 HOURS Performed at [facility], [address], [place], [place] [address]    Report Status PENDING  Incomplete     Labs: BNP (last 3 results) No results for input(s): "BNP" in the last 8760 hours. Basic Metabolic Panel: Recent Labs  Lab 04/11/22 2128 04/12/22 0351  NA 134* 137  K 3.8 4.2  CL 101 109  CO2 23 20*  GLUCOSE 94 88  BUN 17 16   CREATININE 1.14 1.16  CALCIUM 9.4 8.3*   Liver Function Tests: Recent Labs  Lab 04/11/22 2128  AST 34  ALT 40  ALKPHOS 51  BILITOT 2.0*  PROT 8.0  ALBUMIN 4.7   Recent Labs  Lab 04/11/22 2128  LIPASE 27   No results for input(s): "AMMONIA" in the last 168 hours. CBC: Recent Labs  Lab 04/11/22 2128 04/12/22 0351  WBC 7.8  7.9 5.8  NEUTROABS 5.5  --   HGB 15.2  15.2 13.1  HCT 43.1  42.8 36.7*  MCV 85.2  84.3 85.0  PLT 266  251 200   Cardiac Enzymes: Recent Labs  Lab 04/12/22 0020  CKTOTAL 221   BNP: Invalid input(s): "POCBNP" CBG: No results for input(s): "GLUCAP" in the last 168 hours. D-Dimer No results for input(s): "DDIMER" in the last 72 hours. Hgb A1c No results for input(s): "HGBA1C" in the last 72 hours. Lipid Profile No results for input(s): "CHOL", "HDL", "LDLCALC", "TRIG", "CHOLHDL", "LDLDIRECT" in the last 72 hours. Thyroid function studies No results for input(s): "TSH", "T4TOTAL", "T3FREE", "THYROIDAB" in the last 72 hours.  Invalid input(s): "FREET3" Anemia work up No results for input(s): "VITAMINB12", "  FOLATE", "FERRITIN", "TIBC", "IRON", "RETICCTPCT" in the last 72 hours. Urinalysis    Component Value Date/Time   COLORURINE YELLOW (A) 04/11/2022 2250   APPEARANCEUR CLEAR (A) 04/11/2022 2250   LABSPEC 1.024 04/11/2022 2250   PHURINE 5.0 04/11/2022 2250   GLUCOSEU NEGATIVE 04/11/2022 2250   HGBUR NEGATIVE 04/11/2022 2250   BILIRUBINUR NEGATIVE 04/11/2022 2250   KETONESUR 80 (A) 04/11/2022 2250   PROTEINUR NEGATIVE 04/11/2022 2250   NITRITE NEGATIVE 04/11/2022 2250   LEUKOCYTESUR NEGATIVE 04/11/2022 2250   Sepsis Labs Recent Labs  Lab 04/11/22 2128 04/12/22 0351  WBC 7.8  7.9 5.8   Microbiology Recent Results (from the past 240 hour(s))  SARS Coronavirus 2 by RT PCR (hospital order, performed in [facility] hospital lab) *cepheid single result test* Anterior Nasal Swab     Status: None   Collection Time: 04/11/22   9:28 PM   Specimen: Anterior Nasal Swab  Result Value Ref Range Status   SARS Coronavirus 2 by RT PCR NEGATIVE NEGATIVE Final    Comment: (NOTE) SARS-CoV-2 target nucleic acids are NOT DETECTED.  The SARS-CoV-2 RNA is generally detectable in upper and lower respiratory specimens during the acute phase of infection. The lowest concentration of SARS-CoV-2 viral copies this assay can detect is 250 copies / mL. A negative result does not preclude SARS-CoV-2 infection and should not be used as the sole basis for treatment or other patient management decisions.  A negative result may occur with improper specimen collection / handling, submission of specimen other than nasopharyngeal swab, presence of viral mutation(s) within the areas targeted by this assay, and inadequate number of viral copies (<250 copies / mL). A negative result must be combined with clinical observations, patient history, and epidemiological information.  Fact Sheet for Patients:   RoadLapTop.co.zahttps://www.fda.gov/media/158405/download  Fact Sheet for Healthcare Providers: http://kim-miller.com/https://www.fda.gov/media/158404/download  This test is not yet approved or  cleared by the [place] FDA and has been authorized for detection and/or diagnosis of SARS-CoV-2 by FDA under an Emergency Use Authorization (EUA).  This EUA will remain in effect (meaning this test can be used) for the duration of the COVID-19 declaration under Section 564(b)(1) of the Act, 21 U.S.C. section 360bbb-3(b)(1), unless the authorization is terminated or revoked sooner.  Performed at [facility], [address] [place], [place] [address]   Culture, blood (routine x 2)     Status: None (Preliminary result)   Collection Time: 04/12/22 12:20 AM   Specimen: BLOOD  Result Value Ref Range Status   Specimen Description BLOOD RIGHT FA  Final   Special Requests   Final    BOTTLES DRAWN AEROBIC AND ANAEROBIC Blood Culture adequate volume   Culture   Final     NO GROWTH < 12 HOURS Performed at [facility], [address] [place], [place] [address]    Report Status PENDING  Incomplete  Culture, blood (routine x 2)     Status: None (Preliminary result)   Collection Time: 04/12/22 12:20 AM   Specimen: BLOOD  Result Value Ref Range Status   Specimen Description BLOOD RIGHT HAND  Final   Special Requests   Final    BOTTLES DRAWN AEROBIC AND ANAEROBIC Blood Culture adequate volume   Culture   Final    NO GROWTH < 12 HOURS Performed at [facility], [address] [place], [place] [address]    Report Status PENDING  Incomplete     Time coordinating discharge: Over 30 minutes  SIGNED:  Azucena Fallen, DO [facility] 04/12/2022, 5:59 PM Pager   If 7PM-7AM, please contact night-coverage [email or web address]

## 2022-04-12 NOTE — ED Notes (Signed)
Pt to CT

## 2022-04-12 NOTE — ED Notes (Signed)
Pt back from CT

## 2022-04-12 NOTE — H&P (Signed)
[place]   PATIENT NAME: Corey Pham    MR#:  [id number]  DATE OF BIRTH:  24-Oct-1995  DATE OF ADMISSION:  04/11/2022  PRIMARY CARE PHYSICIAN: Pcp, No   Patient is coming from: Home  REQUESTING/REFERRING PHYSICIAN: Chiquita Loth, MD  CHIEF COMPLAINT:   Chief Complaint  Patient presents with   Emesis   Nausea    HISTORY OF PRESENT ILLNESS:  Gee Habig is a 26 y.o. male with medical history significant for nicotine abuse who presented to the emergency room with acute onset of high fever with a Tmax of 104 yesterday, dyspnea fatigue tiredness with no significant cough or wheezing.  He denies any chest pain or palpitations.  He admits to nasal congestion without rhinorrhea or sore throat or earache.  No chest pain or palpitations.  No dysuria, oliguria or hematuria or flank pain.  ED Course: When he came to the ER temperature was 99.1 and heart rate was 136 and with hydration he came down to 79.  Labs revealed unremarkable CBC.  CMP showed a sodium of 134 and total bili of 2 and was otherwise unremarkable.  CK was 221 lactic acid was 1.1 however procalcitonin was 10.21.  UA was negative as well as mono screen.  UA came back with 80 ketones and was otherwise negative.  Blood cultures were drawn.  Imaging: Noncontrasted CT scan showed no acute intracranial abnormalities and abdominal pelvic CT scan showed mild posterior right basal atelectasis and/or early infiltrate with no acute abdominal or pelvic process.  The patient was given 1 L bolus of IV normal saline, 4 mg of IV Zofran, 15 mg of IV Toradol and another 2 L of normal saline.  I ordered IV Rocephin and Zithromax.  He will be admitted to a medical telemetry bed for further evaluation and management PAST MEDICAL HISTORY:  Nicotine abuse vaping.  PAST SURGICAL HISTORY:  History reviewed. No pertinent surgical history.  No previous surgeries.  SOCIAL HISTORY:   Social History   Tobacco Use   Smoking status: Never    Smokeless tobacco: Never  Substance Use Topics   Alcohol use: Yes    Alcohol/week: 0.0 standard drinks of alcohol   He abuses nicotine vaping.  No history of alcohol abuse or illicit drug use. FAMILY HISTORY:  History reviewed. No pertinent family history.  He denies any familial diseases.  DRUG ALLERGIES:  No Known Allergies  REVIEW OF SYSTEMS:   ROS As per history of present illness. All pertinent systems were reviewed above. Constitutional, HEENT, cardiovascular, respiratory, GI, GU, musculoskeletal, neuro, psychiatric, endocrine, integumentary and hematologic systems were reviewed and are otherwise negative/unremarkable except for positive findings mentioned above in the HPI.   MEDICATIONS AT HOME:   Prior to Admission medications   Not on File      VITAL SIGNS:  Blood pressure 124/78, pulse 80, temperature 98.3 F (36.8 C), temperature source Oral, resp. rate 20, height 5\' 8"  (1.727 m), weight 94.8 kg, SpO2 98 %.  PHYSICAL EXAMINATION:  Physical Exam  GENERAL:  26 y.o.-year-old Caucasian male patient lying in the bed with no acute distress.  EYES: Pupils equal, round, reactive to light and accommodation. No scleral icterus. Extraocular muscles intact.  HEENT: Head atraumatic, normocephalic. Oropharynx and nasopharynx clear.  NECK:  Supple, no jugular venous distention. No thyroid enlargement, no tenderness.  LUNGS: Diminished right basal breath sounds with right basal crackles.  No use of accessory muscles of respiration.  CARDIOVASCULAR: Regular rate and rhythm,  S1, S2 normal. No murmurs, rubs, or gallops.  ABDOMEN: Soft, nondistended, nontender. Bowel sounds present. No organomegaly or mass.  EXTREMITIES: No pedal edema, cyanosis, or clubbing.  NEUROLOGIC: Cranial nerves II through XII are intact. Muscle strength 5/5 in all extremities. Sensation intact. Gait not checked.  PSYCHIATRIC: The patient is alert and oriented x 3.  Normal affect and good eye  contact. SKIN: No obvious rash, lesion, or ulcer.   LABORATORY PANEL:   CBC Recent Labs  Lab 04/12/22 0351  WBC 5.8  HGB 13.1  HCT 36.7*  PLT 200   ------------------------------------------------------------------------------------------------------------------  Chemistries  Recent Labs  Lab 04/11/22 2128 04/12/22 0351  NA 134* 137  K 3.8 4.2  CL 101 109  CO2 23 20*  GLUCOSE 94 88  BUN 17 16  CREATININE 1.14 1.16  CALCIUM 9.4 8.3*  AST 34  --   ALT 40  --   ALKPHOS 51  --   BILITOT 2.0*  --    ------------------------------------------------------------------------------------------------------------------  Cardiac Enzymes No results for input(s): "TROPONINI" in the last 168 hours. ------------------------------------------------------------------------------------------------------------------  RADIOLOGY:  CT Abdomen Pelvis W Contrast  Result Date: 04/12/2022 CLINICAL DATA:  Nausea with vomiting and neck pain. EXAM: CT ABDOMEN AND PELVIS WITH CONTRAST TECHNIQUE: Multidetector CT imaging of the abdomen and pelvis was performed using the standard protocol following bolus administration of intravenous contrast. RADIATION DOSE REDUCTION: This exam was performed according to the departmental dose-optimization program which includes automated exposure control, adjustment of the mA and/or kV according to patient size and/or use of iterative reconstruction technique. CONTRAST:  OMNIPAQUE IOHEXOL 300 MG/ML  SOLN COMPARISON:  None Available. FINDINGS: Lower chest: Very mild, hazy atelectasis versus early infiltrate is seen within the posterior aspect of the right lung base. Hepatobiliary: No focal liver abnormality is seen. No gallstones, gallbladder wall thickening, or biliary dilatation. Pancreas: Unremarkable. No pancreatic ductal dilatation or surrounding inflammatory changes. Spleen: Normal in size without focal abnormality. Adrenals/Urinary Tract: Adrenal glands are  unremarkable. Kidneys are normal, without renal calculi, focal lesion, or hydronephrosis. Bladder is unremarkable. Stomach/Bowel: Stomach is within normal limits. Appendix appears normal. No evidence of bowel wall thickening, distention, or inflammatory changes. Vascular/Lymphatic: No significant vascular findings are present. No enlarged abdominal or pelvic lymph nodes. Reproductive: Prostate is unremarkable. Other: No abdominal wall hernia or abnormality. No abdominopelvic ascites. Musculoskeletal: No acute or significant osseous findings. IMPRESSION: 1. Very mild posterior right basilar atelectasis and/or early infiltrate. 2. No acute or active process within the abdomen or pelvis. Electronically Signed   By: Aram Candela M.D.   On: 04/12/2022 00:53   CT Head Wo Contrast  Result Date: 04/12/2022 CLINICAL DATA:  Nausea with vomiting and neck pain. EXAM: CT HEAD WITHOUT CONTRAST TECHNIQUE: Contiguous axial images were obtained from the base of the skull through the vertex without intravenous contrast. RADIATION DOSE REDUCTION: This exam was performed according to the departmental dose-optimization program which includes automated exposure control, adjustment of the mA and/or kV according to patient size and/or use of iterative reconstruction technique. COMPARISON:  None Available. FINDINGS: Brain: No evidence of acute infarction, hemorrhage, hydrocephalus, extra-axial collection or mass lesion/mass effect. Vascular: No hyperdense vessel or unexpected calcification. Skull: Normal. Negative for fracture or focal lesion. Sinuses/Orbits: No acute finding. Other: None. IMPRESSION: No acute intracranial pathology. Electronically Signed   By: Aram Candela M.D.   On: 04/12/2022 00:47   DG Chest Port 1 View  Result Date: 04/12/2022 CLINICAL DATA:  Fever and cough.  Nausea and vomiting  since Monday. EXAM: PORTABLE CHEST 1 VIEW COMPARISON:  05/24/2017 FINDINGS: The heart size and mediastinal contours are  within normal limits. Both lungs are clear. The visualized skeletal structures are unremarkable. IMPRESSION: No active disease. Electronically Signed   By: Burman Nieves M.D.   On: 04/12/2022 00:25      IMPRESSION AND PLAN:  Assessment and Plan: * CAP (community acquired pneumonia) - This is right basal pneumonia with subsequent sepsis. - The patient will be admitted to a medical telemetry bed. - We will continue antibiotic therapy with IV Rocephin and Zithromax. - Mucolytic therapy and bronchodilator therapy will be provided. - We will follow blood cultures.  Sepsis due to pneumonia [facility]) - Management as above. - He will be hydrated with IV normal saline.  Hyponatremia - This is likely hypovolemic. - We will follow BMP with hydration.  Nicotine abuse - I counseled him for vaping cessation and he will receive further counseling here.     DVT prophylaxis: Lovenox.  Advanced Care Planning:  Code Status: full code.  Family Communication:  The plan of care was discussed in details with the patient (and family). I answered all questions. The patient agreed to proceed with the above mentioned plan. Further management will depend upon hospital course. Disposition Plan: Back to previous home environment Consults called: none.  All the records are reviewed and case discussed with ED provider.  Status is: Inpatient    At the time of the admission, it appears that the appropriate admission status for this patient is inpatient.  This is judged to be reasonable and necessary in order to provide the required intensity of service to ensure the patient's safety given the presenting symptoms, physical exam findings and initial radiographic and laboratory data in the context of comorbid conditions.  The patient requires inpatient status due to high intensity of service, high risk of further deterioration and high frequency of surveillance required.  I certify that at the time of admission,  it is my clinical judgment that the patient will require inpatient hospital care extending more than 2 midnights.                            Dispo: The patient is from: Home              Anticipated d/c is to: Home              Patient currently is not medically stable to d/c.              Difficult to place patient: No  Hannah Beat M.D on 04/12/2022 at 5:22 AM  [facility]   From 7 PM-7 AM, contact night-coverage [email or web address]  CC: Primary care physician; Pcp, No

## 2022-04-12 NOTE — Sepsis Progress Note (Signed)
Following per sepsis protocol   

## 2022-04-12 NOTE — Progress Notes (Signed)
CODE SEPSIS - PHARMACY COMMUNICATION  **Broad Spectrum Antibiotics should be administered within 1 hour of Sepsis diagnosis**  Time Code Sepsis Called/Page Received: 0112  Antibiotics Ordered: Azithromycin & Ceftriaxone  Time of 1st antibiotic administration: 0127  Otelia Sergeant, PharmD, MBA 04/12/2022 1:08 AM

## 2022-04-12 NOTE — TOC CM/SW Note (Signed)
Patient has orders to discharge home today. Chart reviewed. On room air. No wounds. No TOC needs identified. CSW signing off. ° °Lafreda Casebeer, CSW °[phone] ° °

## 2022-04-12 NOTE — Progress Notes (Signed)
Pt discharged per MD order. IV removed. Discharge instructions removed. Pt verbalized understanding. All questions answered to pt satisfaction.

## 2022-04-12 NOTE — Progress Notes (Signed)
Patient arrived on unit in wheelchair. Alert and oriented, no distress noted. Patient settled into room, assessment completed. Will continue to monitor according to orders and care plan.

## 2022-04-12 NOTE — Assessment & Plan Note (Signed)
-   I counseled him for vaping cessation and he will receive further counseling here.

## 2022-04-12 NOTE — Assessment & Plan Note (Signed)
-   This is likely hypovolemic. - We will follow BMP with hydration.

## 2022-04-12 NOTE — ED Notes (Signed)
Pt transported via wheelchair by EDT.

## 2022-04-12 NOTE — Assessment & Plan Note (Signed)
-   This is right basal pneumonia with subsequent sepsis. - The patient will be admitted to a medical telemetry bed. - We will continue antibiotic therapy with IV Rocephin and Zithromax. - Mucolytic therapy and bronchodilator therapy will be provided. - We will follow blood cultures.

## 2022-04-12 NOTE — Assessment & Plan Note (Signed)
-   Management as above. - He will be hydrated with IV normal saline.

## 2022-04-17 LAB — CULTURE, BLOOD (ROUTINE X 2)
Culture: NO GROWTH
Culture: NO GROWTH
Special Requests: ADEQUATE
Special Requests: ADEQUATE

## 2022-04-22 ENCOUNTER — Ambulatory Visit: Payer: PRIVATE HEALTH INSURANCE | Admitting: Family Medicine

## 2022-04-22 ENCOUNTER — Encounter: Payer: Self-pay | Admitting: Family Medicine

## 2022-04-22 ENCOUNTER — Ambulatory Visit
Admission: RE | Admit: 2022-04-22 | Discharge: 2022-04-22 | Disposition: A | Discharge status: Home / Self Care | Payer: PRIVATE HEALTH INSURANCE | Source: Ambulatory Visit | Attending: Family Medicine | Admitting: Family Medicine

## 2022-04-22 VITALS — BP 116/70 | HR 98 | Temp 99.0°F | Ht 68.0 in | Wt 207.2 lb

## 2022-04-22 DIAGNOSIS — R509 Fever, unspecified: Secondary | ICD-10-CM | POA: Insufficient documentation

## 2022-04-22 DIAGNOSIS — J189 Pneumonia, unspecified organism: Secondary | ICD-10-CM

## 2022-04-22 MED ORDER — IOHEXOL 300 MG/ML  SOLN
75.0000 mL | Freq: Once | INTRAMUSCULAR | Status: AC | PRN
  Administered 2022-04-22: 75 mL via INTRAVENOUS

## 2022-04-22 NOTE — Patient Instructions (Signed)
Liquid IV.    Boost/ensure, broth, toast, rice, etc.

## 2022-04-22 NOTE — Addendum Note (Signed)
Addended by: Lorn Junes on: 04/22/2022 04:41 PM   Modules accepted: Orders

## 2022-04-22 NOTE — Addendum Note (Signed)
Addended by: Angelena Sole on: 04/22/2022 12:38 PM   Modules accepted: Orders

## 2022-04-22 NOTE — Progress Notes (Signed)
New Patient Office Visit  Subjective:  Patient ID: Corey Pham, male    DOB: [birth year]  Age: 26 y.o. MRN: [id number]  CC:  Chief Complaint  Patient presents with   Establish Care    Need new pcp Follow-up on pneumonia from 2 weeks ago Fasting      HPI Corey Pham presents for new pt.  F/u pneumonia 4-5 days PTA-vomiting at night on 7/25/7/26.  Then fevers and neck pain. Continued w/fever so ER.  Pt admitted 7/27-7/28 for CAP.  Smoker who presented to ED [facility] on 7/27 w/temp 104, dyspnea, fatigue, n/v.  No cough.  .  CT abd/pelvis neg x post R basilar infiltrate vs atelectasis. CT head neg. CXR NAP.  Covid neg. Labs mostly unremarkable. HIV neg. Sent home on azithromycin and zofran.  Did return to UC on 8/3 for continued symptoms.  CXR NAP Fevers still continue-up to 102 still.  Temp still yesterday. Night sweats. Feels fatigued but getting better.  Temps about lunch and 6pm and night sweats all night. Feels SOB some and dizzy if moving around.  Some HA when fever.   Did covid at home, hosp and uc.   Nausea w/food but no more vomiting.   Got tatooe about 1 wk prior to all this(7/15)  Pt has quit delta 8 and vaping.    History reviewed. No pertinent past medical history.  Past Surgical History:  Procedure Laterality Date   CLOSED REDUCTION NASAL FRACTURE  2011    History reviewed. No pertinent family history.  Social History   Socioeconomic History   Marital status: Married    Spouse name: Not on file   Number of children: 1   Years of education: Not on file   Highest education level: Not on file  Occupational History   Not on file  Tobacco Use   Smoking status: Never   Smokeless tobacco: Never  Vaping Use   Vaping Use: Every day   Substances: Nicotine  Substance and Sexual Activity   Alcohol use: Not Currently   Drug use: Yes    Types: Marijuana    Comment: delta 8   Sexual activity: Yes    Birth control/protection: None  Other Topics Concern    Not on file  Social History Narrative   [employer]    Social Determinants of Health   Financial Resource Strain: Not on file  Food Insecurity: Not on file  Transportation Needs: Not on file  Physical Activity: Not on file  Stress: Not on file  Social Connections: Not on file  Intimate Partner Violence: Not on file    ROS  ROS: Gen: no fever, chills  Skin: no rash, itching ENT: no ear pain, ear drainage, nasal congestion, rhinorrhea, sinus pressure, sore throat Eyes: no blurry vision, double vision Resp: no cough, wheeze,SOB CV: no CP, palpitations, LE edema,  GI: no heartburn, n/v/d/c, abd pain GU: no dysuria, urgency, frequency, hematuria MSK: no joint pain, myalgias, back pain Neuro: no dizziness, headache, weakness, vertigo Psych: no depression, anxiety, insomnia, SI   Spent reviewing records.  Getting thorough history, exam, discussing plan. Total 45 min  Objective:   Today's Vitals: BP 116/70   Pulse 98   Temp 99 F (37.2 C) (Temporal)   Ht 5\' 8"  (1.727 m)   Wt 207 lb 4 oz (94 kg)   SpO2 96%   BMI 31.51 kg/m   Physical Exam  Gen: WDWN NAD HEENT: NCAT, conjunctiva not injected, sclera nonicteric TM WNL  B, OP moist, no exudates  NECK:  supple, no thyromegaly, no nodes, no carotid bruits CARDIAC: RRR, S1S2+, no murmur. DP 2+B LUNGS: CTAB. No wheezes ABDOMEN:  BS+, soft, NTND, No HSM, no masses EXT:  no edema MSK: no gross abnormalities.  NEURO: A&O x3.  CN II-XII intact.  PSYCH: normal mood. Good eye contact  Skin hot Tatoo R upper thigh-no s/s infection.  No inq nodes  Assessment & Plan:   Problem List Items Addressed This Visit       Respiratory   CAP (community acquired pneumonia) - Primary   Relevant Orders   Comprehensive metabolic panel   TSH   CBC with Differential/Platelet   QuantiFERON-TB Gold Plus   Lyme Disease Serology w/Reflex   HIV Antibody (routine testing w rflx)   Hepatitis C antibody   CT  Chest W Contrast   Other Visit Diagnoses     Fever, unspecified fever cause       Relevant Orders   Comprehensive metabolic panel   TSH   CBC with Differential/Platelet   QuantiFERON-TB Gold Plus   Lyme Disease Serology w/Reflex   HIV Antibody (routine testing w rflx)   Hepatitis C antibody   CT Chest W Contrast      CAP-still having fever.  ?TB, viral, other.  Check CT chest as had CT abd and neg CXR and still fever and some sob. .  Covid neg x3.  ?hep C/HIV as tatoo but clean needles.  Work on fluids.   Check cbc,cmp,tsh,TB,lyme, hep C, hiv.  Fever-as above.  Consider ID if above all neg. And fever continues.   Outpatient Encounter Medications as of 04/22/2022  Medication Sig   [DISCONTINUED] ondansetron (ZOFRAN) 4 MG tablet Take 1 tablet (4 mg total) by mouth every 6 (six) hours as needed for nausea.   No facility-administered encounter medications on file as of 04/22/2022.    Follow-up: No follow-ups on file.   Angelena Sole, MD

## 2022-04-23 ENCOUNTER — Encounter: Payer: Self-pay | Admitting: Family Medicine

## 2022-04-23 LAB — LYME DISEASE SEROLOGY W/REFLEX: Lyme Total Antibody EIA: NEGATIVE

## 2022-04-23 NOTE — Addendum Note (Signed)
Addended by: Angelena Sole on: 04/23/2022 05:10 PM   Modules accepted: Orders

## 2022-04-24 ENCOUNTER — Other Ambulatory Visit: Payer: PRIVATE HEALTH INSURANCE

## 2022-04-24 ENCOUNTER — Telehealth: Payer: Self-pay | Admitting: Family Medicine

## 2022-04-24 ENCOUNTER — Other Ambulatory Visit (INDEPENDENT_AMBULATORY_CARE_PROVIDER_SITE_OTHER): Payer: PRIVATE HEALTH INSURANCE

## 2022-04-24 DIAGNOSIS — R509 Fever, unspecified: Secondary | ICD-10-CM | POA: Diagnosis not present

## 2022-04-24 LAB — CBC WITH DIFFERENTIAL/PLATELET
Basophils Absolute: 0 10*3/uL (ref 0.0–0.1)
Basophils Relative: 0.6 % (ref 0.0–3.0)
Eosinophils Absolute: 0 10*3/uL (ref 0.0–0.7)
Eosinophils Relative: 0.3 % (ref 0.0–5.0)
HCT: 38.3 % — ABNORMAL LOW (ref 39.0–52.0)
Hemoglobin: 13.3 g/dL (ref 13.0–17.0)
Lymphocytes Relative: 61.9 % — ABNORMAL HIGH (ref 12.0–46.0)
Lymphs Abs: 5 10*3/uL — ABNORMAL HIGH (ref 0.7–4.0)
MCHC: 34.7 g/dL (ref 30.0–36.0)
MCV: 88.1 fl (ref 78.0–100.0)
Monocytes Absolute: 0.9 10*3/uL (ref 0.1–1.0)
Monocytes Relative: 11.2 % (ref 3.0–12.0)
Neutro Abs: 2.1 10*3/uL (ref 1.4–7.7)
Neutrophils Relative %: 26 % — ABNORMAL LOW (ref 43.0–77.0)
Platelets: 262 10*3/uL (ref 150.0–400.0)
RBC: 4.35 Mil/uL (ref 4.22–5.81)
RDW: 13.4 % (ref 11.5–15.5)
WBC: 8.1 10*3/uL (ref 4.0–10.5)

## 2022-04-24 LAB — HEPATIC FUNCTION PANEL
ALT: 111 U/L — ABNORMAL HIGH (ref 0–53)
AST: 91 U/L — ABNORMAL HIGH (ref 0–37)
Albumin: 4.1 g/dL (ref 3.5–5.2)
Alkaline Phosphatase: 73 U/L (ref 39–117)
Bilirubin, Direct: 0.2 mg/dL (ref 0.0–0.3)
Total Bilirubin: 0.9 mg/dL (ref 0.2–1.2)
Total Protein: 7.1 g/dL (ref 6.0–8.3)

## 2022-04-24 LAB — HEPATITIS A ANTIBODY, TOTAL: Hepatitis A AB,Total: NONREACTIVE

## 2022-04-24 LAB — HEPATITIS B SURFACE ANTIGEN: Hepatitis B Surface Ag: NONREACTIVE

## 2022-04-24 NOTE — Telephone Encounter (Signed)
Agree   d/w pt-see lab

## 2022-04-24 NOTE — Telephone Encounter (Signed)
Patient states: - He saw that lab result collected on 08/09 and 08/07 have been resulted - He is concerned due to seeing "abnormal" reading on lab work  Patient requests: - PCP review lab results and call him back in regards to next steps

## 2022-04-25 ENCOUNTER — Other Ambulatory Visit: Payer: Self-pay | Admitting: *Deleted

## 2022-04-25 DIAGNOSIS — R5081 Fever presenting with conditions classified elsewhere: Secondary | ICD-10-CM

## 2022-04-25 DIAGNOSIS — R7989 Other specified abnormal findings of blood chemistry: Secondary | ICD-10-CM

## 2022-04-25 LAB — COMPREHENSIVE METABOLIC PANEL
AG Ratio: 1.6 (calc) (ref 1.0–2.5)
ALT: 79 U/L — ABNORMAL HIGH (ref 9–46)
AST: 61 U/L — ABNORMAL HIGH (ref 10–40)
Albumin: 4.5 g/dL (ref 3.6–5.1)
Alkaline phosphatase (APISO): 81 U/L (ref 36–130)
BUN: 12 mg/dL (ref 7–25)
CO2: 25 mmol/L (ref 20–32)
Calcium: 9.5 mg/dL (ref 8.6–10.3)
Chloride: 102 mmol/L (ref 98–110)
Creat: 1.04 mg/dL (ref 0.60–1.24)
Globulin: 2.8 g/dL (calc) (ref 1.9–3.7)
Glucose, Bld: 102 mg/dL — ABNORMAL HIGH (ref 65–99)
Potassium: 4.7 mmol/L (ref 3.5–5.3)
Sodium: 135 mmol/L (ref 135–146)
Total Bilirubin: 1.3 mg/dL — ABNORMAL HIGH (ref 0.2–1.2)
Total Protein: 7.3 g/dL (ref 6.1–8.1)

## 2022-04-25 LAB — TEST AUTHORIZATION

## 2022-04-25 LAB — QUANTIFERON-TB GOLD PLUS
Mitogen-NIL: 8.87 IU/mL
NIL: 0.59 IU/mL
QuantiFERON-TB Gold Plus: NEGATIVE
TB1-NIL: 0.04 IU/mL
TB2-NIL: 0.07 IU/mL

## 2022-04-25 LAB — PATHOLOGIST SMEAR REVIEW

## 2022-04-25 LAB — HEPATITIS C ANTIBODY: Hepatitis C Ab: NONREACTIVE

## 2022-04-25 LAB — MONONUCLEOSIS SCREEN: Heterophile, Mono Screen: POSITIVE — AB

## 2022-04-25 LAB — TSH: TSH: 1.29 mIU/L (ref 0.40–4.50)

## 2022-04-25 LAB — HIV ANTIBODY (ROUTINE TESTING W REFLEX): HIV 1&2 Ab, 4th Generation: NONREACTIVE

## 2022-04-25 NOTE — Progress Notes (Signed)
Cbc

## 2022-04-25 NOTE — Progress Notes (Signed)
Path review-looks reactive which favors the mono.   Still repeat 2 wks.

## 2022-05-07 ENCOUNTER — Other Ambulatory Visit (INDEPENDENT_AMBULATORY_CARE_PROVIDER_SITE_OTHER): Payer: PRIVATE HEALTH INSURANCE

## 2022-05-07 DIAGNOSIS — R7989 Other specified abnormal findings of blood chemistry: Secondary | ICD-10-CM

## 2022-05-07 DIAGNOSIS — R5081 Fever presenting with conditions classified elsewhere: Secondary | ICD-10-CM

## 2022-05-07 LAB — CBC WITH DIFFERENTIAL/PLATELET
Basophils Absolute: 0 10*3/uL (ref 0.0–0.1)
Basophils Relative: 0.5 % (ref 0.0–3.0)
Eosinophils Absolute: 0.1 10*3/uL (ref 0.0–0.7)
Eosinophils Relative: 0.8 % (ref 0.0–5.0)
HCT: 39.5 % (ref 39.0–52.0)
Hemoglobin: 13.6 g/dL (ref 13.0–17.0)
Lymphocytes Relative: 65.3 % — ABNORMAL HIGH (ref 12.0–46.0)
Lymphs Abs: 4.8 10*3/uL — ABNORMAL HIGH (ref 0.7–4.0)
MCHC: 34.4 g/dL (ref 30.0–36.0)
MCV: 89.4 fl (ref 78.0–100.0)
Monocytes Absolute: 0.7 10*3/uL (ref 0.1–1.0)
Monocytes Relative: 9.6 % (ref 3.0–12.0)
Neutro Abs: 1.8 10*3/uL (ref 1.4–7.7)
Neutrophils Relative %: 23.8 % — ABNORMAL LOW (ref 43.0–77.0)
Platelets: 290 10*3/uL (ref 150.0–400.0)
RBC: 4.42 Mil/uL (ref 4.22–5.81)
RDW: 14.3 % (ref 11.5–15.5)
WBC: 7.4 10*3/uL (ref 4.0–10.5)

## 2022-05-07 LAB — HEPATIC FUNCTION PANEL
ALT: 52 U/L (ref 0–53)
AST: 31 U/L (ref 0–37)
Albumin: 4.3 g/dL (ref 3.5–5.2)
Alkaline Phosphatase: 64 U/L (ref 39–117)
Bilirubin, Direct: 0.2 mg/dL (ref 0.0–0.3)
Total Bilirubin: 1.2 mg/dL (ref 0.2–1.2)
Total Protein: 7.3 g/dL (ref 6.0–8.3)

## 2022-05-07 NOTE — Progress Notes (Signed)
Liver is back to normal.  Lymphocytes still elevated.   How is he feeling?  Any fevers?  Has he stopped taking tylenol?

## 2022-05-09 ENCOUNTER — Telehealth: Payer: Self-pay | Admitting: Family Medicine

## 2022-05-09 NOTE — Telephone Encounter (Signed)
Returned call to patient. Called patient to follow-up on how he was doing since last ov.

## 2022-05-09 NOTE — Telephone Encounter (Signed)
Pt states: -returning a call from PCP team -Has a meeting today (08/24) 2:30-3:30pm -More easily reached before 2:30pm today.   Pt requests: -Call back

## 2022-05-10 ENCOUNTER — Other Ambulatory Visit: Payer: Self-pay | Admitting: *Deleted

## 2022-05-10 DIAGNOSIS — D72820 Lymphocytosis (symptomatic): Secondary | ICD-10-CM

## 2022-05-30 ENCOUNTER — Ambulatory Visit: Payer: Self-pay | Admitting: Nurse Practitioner

## 2022-08-29 ENCOUNTER — Encounter: Payer: Self-pay | Admitting: *Deleted

## 2022-10-29 ENCOUNTER — Ambulatory Visit (INDEPENDENT_AMBULATORY_CARE_PROVIDER_SITE_OTHER): Payer: PRIVATE HEALTH INSURANCE | Admitting: Family

## 2022-10-29 ENCOUNTER — Encounter: Payer: Self-pay | Admitting: Family

## 2022-10-29 VITALS — BP 107/77 | HR 88 | Temp 97.7°F | Ht 68.0 in | Wt 215.2 lb

## 2022-10-29 DIAGNOSIS — J069 Acute upper respiratory infection, unspecified: Secondary | ICD-10-CM | POA: Diagnosis not present

## 2022-10-29 LAB — POC COVID19 BINAXNOW: SARS Coronavirus 2 Ag: NEGATIVE

## 2022-10-29 LAB — POCT INFLUENZA A/B
Influenza A, POC: NEGATIVE
Influenza B, POC: NEGATIVE

## 2022-10-29 NOTE — Progress Notes (Signed)
   Patient ID: Albaro Deviney, male    DOB: [birth year], 27 y.o.   MRN: [id number]  Chief Complaint  Patient presents with   Sinus Problem    sx for 1d    HPI:      URI sx:  Pt c/o chills, nausea, fatigue and Nasal congestion, Present since last night. Has tried tylenol. Has not had a covid test. Denies any fever or body aches, did not get flu shot this year. Has 85 mo old and wife is pregnant & concerned about spreading infection     Assessment & Plan:  1. Viral upper respiratory tract infection - rapid testing neg. Continue OTC sinus/cold medications including generic Sudafed, Claritin D, or Mucinex per box directions, Ibuprofen up to 600mg , generic Aleve 1-2 tabs bid or generic Tylenol 1,000mg  every 6 hours. Use nasal saline spray tid, humidifier overnight. Increase water intake to at least 2 liters qd.   - POC COVID-19 - POCT Influenza A/B  Subjective:    No outpatient medications prior to visit.   No facility-administered medications prior to visit.   No past medical history on file. Past Surgical History:  Procedure Laterality Date   CLOSED REDUCTION NASAL FRACTURE  2011   No Known Allergies    Objective:    Physical Exam Vitals and nursing note reviewed.  Constitutional:      General: He is not in acute distress.    Appearance: Normal appearance. He is not ill-appearing.  HENT:     Head: Normocephalic.     Right Ear: Tympanic membrane and ear canal normal.     Left Ear: Tympanic membrane and ear canal normal.     Nose:     Right Sinus: No maxillary sinus tenderness or frontal sinus tenderness.     Left Sinus: No maxillary sinus tenderness or frontal sinus tenderness.     Mouth/Throat:     Mouth: Mucous membranes are moist.     Pharynx: No pharyngeal swelling, oropharyngeal exudate, posterior oropharyngeal erythema or uvula swelling.     Tonsils: No tonsillar exudate or tonsillar abscesses.  Cardiovascular:     Rate and Rhythm: Normal rate and regular rhythm.   Pulmonary:     Effort: Pulmonary effort is normal.     Breath sounds: Normal breath sounds.  Musculoskeletal:        General: Normal range of motion.     Cervical back: Normal range of motion.  Lymphadenopathy:     Head:     Right side of head: No preauricular or posterior auricular adenopathy.     Left side of head: No preauricular or posterior auricular adenopathy.     Cervical: No cervical adenopathy.  Skin:    General: Skin is warm and dry.  Neurological:     Mental Status: He is alert and oriented to person, place, and time.  Psychiatric:        Mood and Affect: Mood normal.    BP 107/77 (BP Location: Left Arm, Patient Position: Sitting, Cuff Size: Large)   Pulse 88   Temp 97.7 F (36.5 C) (Temporal)   Ht 5\' 8"  (1.727 m)   Wt 215 lb 3.2 oz (97.6 kg)   SpO2 98%   BMI 32.72 kg/m  Wt Readings from Last 3 Encounters:  10/29/22 215 lb 3.2 oz (97.6 kg)  04/22/22 207 lb 4 oz (94 kg)  04/12/22 209 lb (94.8 kg)       Jeanie Sewer, NP

## 2022-10-30 ENCOUNTER — Encounter: Payer: Self-pay | Admitting: Family
